# Patient Record
Sex: Male | Born: 1980 | Race: Black or African American | Hispanic: No | Marital: Single | Smoking: Never smoker
Health system: Southern US, Community
[De-identification: ages and names within clinical notes are randomized; demographics above are authoritative.]

## PROBLEM LIST (undated history)

## (undated) DIAGNOSIS — Z789 Other specified health status: Secondary | ICD-10-CM

## (undated) HISTORY — DX: Other specified health status: Z78.9

---

## 2007-08-24 HISTORY — PX: COLONOSCOPY: SHX174

## 2011-11-09 ENCOUNTER — Encounter: Payer: Self-pay | Admitting: Internal Medicine

## 2011-11-09 ENCOUNTER — Other Ambulatory Visit: Payer: Self-pay | Admitting: Internal Medicine

## 2011-11-09 ENCOUNTER — Ambulatory Visit (INDEPENDENT_AMBULATORY_CARE_PROVIDER_SITE_OTHER): Payer: BC Managed Care – PPO | Admitting: Internal Medicine

## 2011-11-09 VITALS — BP 114/80 | HR 96 | Temp 98.7°F | Resp 18 | Ht 68.25 in | Wt 212.0 lb

## 2011-11-09 DIAGNOSIS — R200 Anesthesia of skin: Secondary | ICD-10-CM

## 2011-11-09 DIAGNOSIS — R209 Unspecified disturbances of skin sensation: Secondary | ICD-10-CM

## 2011-11-09 DIAGNOSIS — Z0000 Encounter for general adult medical examination without abnormal findings: Secondary | ICD-10-CM | POA: Insufficient documentation

## 2011-11-09 NOTE — Progress Notes (Signed)
  Subjective:    Patient ID: William Hurst, male    DOB: Sep 29, 1980, 30 y.o.   MRN: [id number]  HPI Pt presents to clinic for complete physical. Notes 4-5 month h/o left >right lower leg intermittent numbness involving calf and sometimes foot. Noted sx's after suffered a bad sprain of left ankle (eversion). Had no knee or fibula injury. Ultimately wondered about other possible causes after developed similar sx's in right leg.  Denies back pain, leg weakness, or radicular leg pain. Sometimes is positional after sittting in car. Taking no medication for the problem. No other alleviating or exacerbating factors. Notes past h/o elevated bp without need of medications.   No past medical history on file. Past Surgical History  Procedure Date  . Colonoscopy 2009    normal per patient report    reports that he has never smoked. He has never used smokeless tobacco. He reports that he does not use illicit drugs. His alcohol history not on file. family history includes Diabetes in his father and Hypertension in his father and paternal grandmother.  There is no history of Breast cancer, and Colon cancer, and Prostate cancer, and Heart disease, . No Known Allergies     Review of Systems  Constitutional: Negative for fever, chills, diaphoresis and fatigue.  Respiratory: Negative for cough and shortness of breath.   Gastrointestinal: Negative for abdominal pain and blood in stool.  Musculoskeletal: Negative for back pain.  Neurological: Positive for numbness. Negative for weakness.  All other systems reviewed and are negative.       Objective:   Physical Exam  Physical Exam  Nursing note and vitals reviewed. Constitutional: He appears well-developed and well-nourished. No distress.  HENT:  Head: Normocephalic and atraumatic.  Right Ear: Tympanic membrane and external ear normal.  Left Ear: Tympanic membrane and external ear normal.  Nose: Nose normal.  Mouth/Throat: Uvula is midline,  oropharynx is clear and moist and mucous membranes are normal. No oropharyngeal exudate.  Eyes: Conjunctivae and EOM are normal. Pupils are equal, round, and reactive to light. Right eye exhibits no discharge. Left eye exhibits no discharge. No scleral icterus.  Neck: Neck supple. Carotid bruit is not present. No thyromegaly present.  Cardiovascular: Normal rate, regular rhythm and normal heart sounds.  Exam reveals no gallop and no friction rub.   No murmur heard. Pulmonary/Chest: Effort normal and breath sounds normal. No respiratory distress. He has no wheezes. He has no rales.  Abdominal: Soft. He exhibits no distension and no mass. There is no hepatosplenomegaly. There is no tenderness. There is no rebound. Hernia confirmed negative in the right inguinal area and confirmed negative in the left inguinal area.  Lymphadenopathy:    He has no cervical adenopathy.      Neurological: He is alert.  Skin: Skin is warm and dry. He is not diaphoretic.  Psychiatric: He has a normal mood and affect.   Neuro: bilateral le strenght 5/5. Gait nl. Monofilament testing nl bilateral lower legs and feet     Assessment & Plan:

## 2011-11-09 NOTE — Patient Instructions (Signed)
Please schedule fasting labs for tomorrow morning Cbc, chem7, a1c, lipid, lft, tsh, b12, urinalysis with reflex -v70.0 Also please schedule same labs for next year's physical

## 2011-11-09 NOTE — Assessment & Plan Note (Signed)
Nl exam. Obtain cpe labs. 

## 2011-11-09 NOTE — Assessment & Plan Note (Signed)
Obtain labs including glu, thyroid function and vitamin b12. If lab evaluation unrevealing then consider neurology consult. No clinical support for peroneal nerve injury

## 2011-11-11 LAB — URINALYSIS, ROUTINE W REFLEX MICROSCOPIC
Hgb urine dipstick: NEGATIVE
Ketones, ur: NEGATIVE mg/dL
Nitrite: NEGATIVE
Protein, ur: NEGATIVE mg/dL
Urobilinogen, UA: 0.2 mg/dL (ref 0.0–1.0)

## 2011-11-11 LAB — BASIC METABOLIC PANEL
BUN: 12 mg/dL (ref 6–23)
CO2: 26 mEq/L (ref 19–32)
Chloride: 101 mEq/L (ref 96–112)
Creat: 0.95 mg/dL (ref 0.50–1.35)

## 2011-11-11 LAB — TSH: TSH: 0.941 u[IU]/mL (ref 0.350–4.500)

## 2011-11-11 LAB — CBC
HCT: 48.2 % (ref 39.0–52.0)
MCV: 89.3 fL (ref 78.0–100.0)
RDW: 12.5 % (ref 11.5–15.5)
WBC: 4.6 10*3/uL (ref 4.0–10.5)

## 2011-11-11 LAB — HEMOGLOBIN A1C: Mean Plasma Glucose: 114 mg/dL (ref <117)

## 2011-11-11 LAB — VITAMIN B12: Vitamin B-12: 443 pg/mL (ref 211–911)

## 2011-11-11 LAB — LIPID PANEL
Total CHOL/HDL Ratio: 6 Ratio
VLDL: 35 mg/dL (ref 0–40)

## 2011-11-18 ENCOUNTER — Other Ambulatory Visit: Payer: Self-pay | Admitting: Internal Medicine

## 2011-11-18 DIAGNOSIS — E785 Hyperlipidemia, unspecified: Secondary | ICD-10-CM

## 2011-11-22 ENCOUNTER — Other Ambulatory Visit: Payer: Self-pay | Admitting: Internal Medicine

## 2011-11-22 DIAGNOSIS — R200 Anesthesia of skin: Secondary | ICD-10-CM

## 2012-05-25 ENCOUNTER — Encounter: Payer: Self-pay | Admitting: Internal Medicine

## 2012-05-25 ENCOUNTER — Ambulatory Visit (INDEPENDENT_AMBULATORY_CARE_PROVIDER_SITE_OTHER): Payer: BC Managed Care – PPO | Admitting: Internal Medicine

## 2012-05-25 VITALS — BP 124/90 | HR 77 | Temp 98.6°F | Resp 16 | Wt 214.8 lb

## 2012-05-25 DIAGNOSIS — R350 Frequency of micturition: Secondary | ICD-10-CM | POA: Insufficient documentation

## 2012-05-25 DIAGNOSIS — R3589 Other polyuria: Secondary | ICD-10-CM

## 2012-05-25 LAB — POCT URINALYSIS DIPSTICK
Blood, UA: NEGATIVE
Spec Grav, UA: 1.025
Urobilinogen, UA: 0.2

## 2012-05-25 MED ORDER — CIPROFLOXACIN HCL 500 MG PO TABS: 500.0000 mg | ORAL_TABLET | Freq: Two times a day (BID) | ORAL | Status: DC

## 2012-05-25 NOTE — Progress Notes (Signed)
  Subjective:    Patient ID: William Hurst, male    DOB: 25-Jul-1981, 31 y.o.   MRN: [id number]  HPI patient presents to clinic for evaluation of urinary frequency. Notes two-week history of increased urination without polyuria, dysuria, hematuria hesitancy or dribbling. Does note nocturia one to two times a night. Denies urethral discharge and is monogamous. No known history of hyperglycemia. No other alleviating or exacerbating factors. No other complaints. Declines influenza vaccine  No past medical history on file. Past Surgical History  Procedure Date  . Colonoscopy 2009    normal per patient report    reports that he has never smoked. He has never used smokeless tobacco. He reports that he does not use illicit drugs. His alcohol history not on file. family history includes Diabetes in his father and Hypertension in his father and paternal grandmother.  There is no history of Breast cancer, and Colon cancer, and Prostate cancer, and Heart disease, . No Known Allergies   Review of Systems see history of present illness     Objective:   Physical Exam  Nursing note and vitals reviewed. Constitutional: He appears well-developed and well-nourished. No distress.  HENT:  Head: Normocephalic and atraumatic.  Neurological: He is alert.  Skin: He is not diaphoretic.  Psychiatric: He has a normal mood and affect.          Assessment & Plan:

## 2012-05-25 NOTE — Assessment & Plan Note (Signed)
UA obtained demonstrating increased specific gravity and trace protein. Will obtain urine culture and urine LCR for GC and chlamydia. Begin ten-day course of Cipro 500 mg twice a day. Fasting fingerstick blood sugar normal. Followup if no improvement or worsening.

## 2012-05-26 LAB — GC/CHLAMYDIA PROBE AMP, URINE
Chlamydia, Swab/Urine, PCR: NEGATIVE
GC Probe Amp, Urine: NEGATIVE

## 2012-05-27 LAB — URINE CULTURE: Organism ID, Bacteria: NO GROWTH

## 2012-08-11 ENCOUNTER — Ambulatory Visit (HOSPITAL_BASED_OUTPATIENT_CLINIC_OR_DEPARTMENT_OTHER)
Admission: RE | Admit: 2012-08-11 | Discharge: 2012-08-11 | Disposition: A | Discharge status: Home / Self Care | Payer: BC Managed Care – PPO | Source: Ambulatory Visit | Attending: Internal Medicine | Admitting: Internal Medicine

## 2012-08-11 ENCOUNTER — Encounter: Payer: Self-pay | Admitting: Internal Medicine

## 2012-08-11 ENCOUNTER — Ambulatory Visit (INDEPENDENT_AMBULATORY_CARE_PROVIDER_SITE_OTHER): Payer: BC Managed Care – PPO | Admitting: Internal Medicine

## 2012-08-11 VITALS — BP 130/88 | HR 69 | Temp 98.5°F | Resp 14 | Wt 216.5 lb

## 2012-08-11 DIAGNOSIS — N509 Disorder of male genital organs, unspecified: Secondary | ICD-10-CM

## 2012-08-11 DIAGNOSIS — N50812 Left testicular pain: Secondary | ICD-10-CM

## 2012-08-11 MED ORDER — LEVOFLOXACIN 500 MG PO TABS: 500.0000 mg | ORAL_TABLET | Freq: Every day | ORAL | Status: DC

## 2012-08-11 NOTE — Progress Notes (Signed)
  Subjective:    Patient ID: William Hurst, male    DOB: 1981-04-07, 31 y.o.   MRN: [id number]  HPI Pt presents to clinic for evaluation of testicular pain. Notes ~3wk h/o left testicular discomfort described as mild. May have occurred after prolonged time without stopping to urinate. Past urinary frequency seems to have improved after course of abx but c/o persistent post void dribbling. Denies urethral discharge. No alleviating or exacerbating factors. Declines flu vaccine.   No past medical history on file. Past Surgical History  Procedure Date  . Colonoscopy 2009    normal per patient report    reports that he has never smoked. He has never used smokeless tobacco. He reports that he does not use illicit drugs. His alcohol history not on file. family history includes Diabetes in his father and Hypertension in his father and paternal grandmother.  There is no history of Breast cancer, and Colon cancer, and Prostate cancer, and Heart disease, . No Known Allergies   Review of Systems see hpi     Objective:   Physical Exam  Nursing note and vitals reviewed. Constitutional: He appears well-developed and well-nourished. No distress.  HENT:  Head: Normocephalic and atraumatic.  Eyes: Conjunctivae normal are normal.  Genitourinary: Testes normal and penis normal. Right testis shows no mass, no swelling and no tenderness. Right testis is descended. Left testis shows no mass, no swelling and no tenderness. Left testis is descended.  Neurological: He is alert.  Skin: Skin is warm and dry. He is not diaphoretic.  Psychiatric: He has a normal mood and affect.          Assessment & Plan:

## 2012-08-11 NOTE — Assessment & Plan Note (Signed)
Nl exam. Obtain ua, urine cx, urine lcr for gc/chlamydia. Schedule testicular US. Attempt course of levaquin. With chronic intermittent urinary sx's as well consider urology consult if no resolution.

## 2012-08-12 LAB — URINALYSIS, ROUTINE W REFLEX MICROSCOPIC
Bilirubin Urine: NEGATIVE
Glucose, UA: NEGATIVE mg/dL
Leukocytes, UA: NEGATIVE
Protein, ur: NEGATIVE mg/dL
Specific Gravity, Urine: 1.025 (ref 1.005–1.030)
Urobilinogen, UA: 0.2 mg/dL (ref 0.0–1.0)

## 2012-08-12 LAB — GC/CHLAMYDIA PROBE AMP, URINE: GC Probe Amp, Urine: NEGATIVE

## 2012-08-13 LAB — URINE CULTURE: Colony Count: 1000

## 2012-11-09 ENCOUNTER — Encounter: Payer: BC Managed Care – PPO | Admitting: Family Medicine

## 2012-11-09 DIAGNOSIS — Z0289 Encounter for other administrative examinations: Secondary | ICD-10-CM

## 2013-04-27 ENCOUNTER — Ambulatory Visit (INDEPENDENT_AMBULATORY_CARE_PROVIDER_SITE_OTHER): Payer: BC Managed Care – PPO | Admitting: Physician Assistant

## 2013-04-27 ENCOUNTER — Encounter: Payer: Self-pay | Admitting: Physician Assistant

## 2013-04-27 VITALS — BP 124/90 | HR 77 | Temp 98.6°F | Resp 16 | Wt 212.5 lb

## 2013-04-27 DIAGNOSIS — R252 Cramp and spasm: Secondary | ICD-10-CM

## 2013-04-27 DIAGNOSIS — R350 Frequency of micturition: Secondary | ICD-10-CM

## 2013-04-27 LAB — POCT URINALYSIS DIPSTICK
Bilirubin, UA: NEGATIVE
Glucose, UA: NEGATIVE
Ketones, UA: NEGATIVE
Spec Grav, UA: 1.01
Urobilinogen, UA: 0.2

## 2013-04-27 LAB — BASIC METABOLIC PANEL
BUN: 9 mg/dL (ref 6–23)
CO2: 26 mEq/L (ref 19–32)
Chloride: 102 mEq/L (ref 96–112)
Glucose, Bld: 84 mg/dL (ref 70–99)
Potassium: 4.5 mEq/L (ref 3.5–5.3)

## 2013-04-27 NOTE — Patient Instructions (Signed)
Please increase water intake.  Avoid water intake just before bed.  Avoid caffeine as it irritates the bladder and can cause the urge to urinate.  Please limit alcohol consumption.  Try a daily cranberry supplement (cranberry AZO).  If your culture comes back positive, we will treat you with antibiotics.  Follow-up in 2 weeks.  I will call you with lab results.  Please increase your potassium intake to help with muscle cramps.    Foods Rich in Potassium Food / Potassium (mg)  Apricots, dried,  cup / 378 mg   Apricots, raw, 1 cup halves / 401 mg   Avocado,  / 487 mg   Banana, 1 large / 487 mg   Beef, lean, round, 3 oz / 202 mg   Cantaloupe, 1 cup cubes / 427 mg   Dates, medjool, 5 whole / 835 mg   Ham, cured, 3 oz / 212 mg   Lentils, dried,  cup / 458 mg   Lima beans, frozen,  cup / 258 mg   Orange, 1 large / 333 mg   Orange juice, 1 cup / 443 mg   Peaches, dried,  cup / 398 mg   Peas, split, cooked,  cup / 355 mg   Potato, boiled, 1 medium / 515 mg   Prunes, dried, uncooked,  cup / 318 mg   Raisins,  cup / 309 mg   Salmon, pink, raw, 3 oz / 275 mg   Sardines, canned , 3 oz / 338 mg   Tomato, raw, 1 medium / 292 mg   Tomato juice, 6 oz / 417 mg   [place], 3 oz / 349 mg  Document Released: 08/09/2005 Document Revised: 04/21/2011 Document Reviewed: 12/23/2008 [facility] Patient Information 2012 [place], [place].

## 2013-04-27 NOTE — Progress Notes (Signed)
Patient ID: William Hurst, male   DOB: 11-22-1980, 32 y.o.   MRN: [id number]  Patient presents to clinic today c/o increased urinary frequency for the past several weeks.  Denies dysuria, urinary urgency, urinary hesitancy, post-void dribbling, fever, chills, N/V, suprapubic pain, flank pain, hematuria.  Patient is sexually active with his wife.  Endorses monogamy but no use of portection.  Denies penile/scrotal/testicular pain or tenderness.  Denies discharge.  Denies history of diabetes.  Endorses moderate caffeine intake and alcohol consumption, mostly in the evenings.  Patient also complains of muscle crams of LLE that have been present for the past few weeks.  Denies muscle cramping elsewhere.  Endorses some muscle weakness.  Denies numbness, tingling, burning of extremities.  Denies trauma to LLE.  Denies history of hypokalemia or other electrolyte abnormality.  Does not interfere with daily life.  Patient remains very active.  Does not always eat a well-balanced diet.   No past medical history on file.  No current outpatient prescriptions on file prior to visit.   No current facility-administered medications on file prior to visit.    No Known Allergies  Family History  Problem Relation Age of Onset  . Hypertension Father   . Hypertension Paternal Grandmother   . Breast cancer Neg Hx   . Colon cancer Neg Hx   . Prostate cancer Neg Hx   . Diabetes Father   . Heart disease Neg Hx     History   Social History  . Marital Status: Single    Spouse Name: N/A    Number of Children: N/A  . Years of Education: N/A   Social History Main Topics  . Smoking status: Never Smoker   . Smokeless tobacco: Never Used  . Alcohol Use: None  . Drug Use: No  . Sexual Activity: None   Other Topics Concern  . None   Social History Narrative  . None   Review of Systems  Constitutional: Negative for fever, chills, weight loss and malaise/fatigue.  Gastrointestinal: Negative for nausea,  vomiting, abdominal pain, diarrhea and constipation.  Genitourinary: Positive for frequency. Negative for dysuria, urgency, hematuria and flank pain.  Musculoskeletal: Positive for myalgias. Negative for back pain.  Neurological: Negative for tingling and sensory change.  Endo/Heme/Allergies: Negative for polydipsia.   Filed Vitals:   04/27/13 1327  BP: 124/90  Pulse: 77  Temp: 98.6 F (37 C)  Resp: 16    Physical Exam  Vitals reviewed. Constitutional: He is oriented to person, place, and time and well-developed, well-nourished, and in no distress.  HENT:  Head: Normocephalic and atraumatic.  Neck: Neck supple.  Cardiovascular: Normal rate, regular rhythm, normal heart sounds and intact distal pulses.   Pulmonary/Chest: Effort normal and breath sounds normal.  Musculoskeletal: He exhibits no edema and no tenderness.  Neurological: He is alert and oriented to person, place, and time. No cranial nerve deficit.  Skin: Skin is warm and dry. No rash noted.   Diagnostics: Urine dipstick -- no evidence of infection.   POC Glucose -- WNL  Assessment/Plan: Urinary frequency Urine dipstick WNL. Will send for micro and culture.  Urine G/C sent.  POC Glucose WNL.  Advised patient to eliminate caffeine from diet.  Encourage decrease in alcohol, especially before bedtime.  F/U in 2 weeks.  Muscle cramping Check BMP at today's visit.  Encourage balanced diet with foods high in potassium.  List given to patient.  Follow-up in 2 weeks.

## 2013-04-27 NOTE — Assessment & Plan Note (Signed)
Urine dipstick WNL. Will send for micro and culture.  Urine G/C sent.  POC Glucose WNL.  Advised patient to eliminate caffeine from diet.  Encourage decrease in alcohol, especially before bedtime.  F/U in 2 weeks.

## 2013-04-27 NOTE — Assessment & Plan Note (Signed)
Check BMP at today's visit.  Encourage balanced diet with foods high in potassium.  List given to patient.  Follow-up in 2 weeks.

## 2013-04-28 LAB — URINALYSIS, ROUTINE W REFLEX MICROSCOPIC
Bilirubin Urine: NEGATIVE
Glucose, UA: NEGATIVE mg/dL
Ketones, ur: NEGATIVE mg/dL
Specific Gravity, Urine: 1.008 (ref 1.005–1.030)

## 2013-05-11 ENCOUNTER — Encounter: Payer: Self-pay | Admitting: Physician Assistant

## 2013-05-11 ENCOUNTER — Ambulatory Visit (INDEPENDENT_AMBULATORY_CARE_PROVIDER_SITE_OTHER): Payer: BC Managed Care – PPO | Admitting: Physician Assistant

## 2013-05-11 VITALS — BP 118/92 | HR 60 | Temp 98.7°F | Resp 16 | Ht 68.5 in | Wt 211.2 lb

## 2013-05-11 DIAGNOSIS — R3911 Hesitancy of micturition: Secondary | ICD-10-CM

## 2013-05-11 DIAGNOSIS — R350 Frequency of micturition: Secondary | ICD-10-CM

## 2013-05-11 DIAGNOSIS — Z0000 Encounter for general adult medical examination without abnormal findings: Secondary | ICD-10-CM

## 2013-05-11 LAB — HEPATIC FUNCTION PANEL
AST: 16 U/L (ref 0–37)
Albumin: 4.5 g/dL (ref 3.5–5.2)
Alkaline Phosphatase: 48 U/L (ref 39–117)
Indirect Bilirubin: 0.4 mg/dL (ref 0.0–0.9)
Total Bilirubin: 0.5 mg/dL (ref 0.3–1.2)
Total Protein: 7.4 g/dL (ref 6.0–8.3)

## 2013-05-11 LAB — CBC WITH DIFFERENTIAL/PLATELET
Eosinophils Absolute: 0.1 10*3/uL (ref 0.0–0.7)
Eosinophils Relative: 2 % (ref 0–5)
HCT: 46.2 % (ref 39.0–52.0)
Hemoglobin: 15.7 g/dL (ref 13.0–17.0)
Lymphocytes Relative: 64 % — ABNORMAL HIGH (ref 12–46)
Lymphs Abs: 2.9 10*3/uL (ref 0.7–4.0)
MCH: 30.1 pg (ref 26.0–34.0)
MCV: 88.5 fL (ref 78.0–100.0)
Monocytes Relative: 6 % (ref 3–12)
RBC: 5.22 MIL/uL (ref 4.22–5.81)

## 2013-05-11 LAB — LIPID PANEL
LDL Cholesterol: 192 mg/dL — ABNORMAL HIGH (ref 0–99)
Total CHOL/HDL Ratio: 6.1 Ratio

## 2013-05-11 LAB — HEMOGLOBIN A1C: Hgb A1c MFr Bld: 5.8 % — ABNORMAL HIGH (ref <5.7)

## 2013-05-11 NOTE — Progress Notes (Signed)
Patient ID: William Hurst, male   DOB: 02-Nov-1980, 32 y.o.   MRN: [id number]  Patient presents to clinic today for annual exam.  Patient with no significant PMH.   Urinary frequency improved with lifestyle measures discussed at last visit.  Labs unremarkable at that time.  Patient requests PSA due to family history and father's prostate "issues".  Denies prostate cancer in family.  Health Maintenance: Vision -- Overdue.  Discussed importance of annual eye exam Dental -- last visit 2 days ago.  Is scheduled to have wisdom teeth removal Immunizations -- patient endorses being up to date.  Patient declines flu shot.   History reviewed. No pertinent past medical history.  Current Outpatient Prescriptions on File Prior to Visit  Medication Sig Dispense Refill  . Multiple Vitamins-Minerals (MENS MULTIVITAMIN PLUS) TABS Take 1 tablet by mouth daily.       No current facility-administered medications on file prior to visit.    No Known Allergies  Family History  Problem Relation Age of Onset  . Hypertension Father   . Hypertension Paternal Grandmother   . Breast cancer Neg Hx   . Colon cancer Neg Hx   . Prostate cancer Neg Hx   . Diabetes Father   . Heart disease Neg Hx     History   Social History  . Marital Status: Single    Spouse Name: N/A    Number of Children: N/A  . Years of Education: N/A   Social History Main Topics  . Smoking status: Never Smoker   . Smokeless tobacco: Never Used  . Alcohol Use: Yes     Comment: rare  . Drug Use: No  . Sexual Activity: Yes    Birth Control/ Protection: None     Comment: married   Other Topics Concern  . None   Social History Narrative  . None   Review of Systems  Constitutional: Negative for fever, chills, weight loss and malaise/fatigue.  HENT: Negative for hearing loss, ear pain, tinnitus and ear discharge.   Eyes: Negative for blurred vision, double vision, photophobia and pain.  Respiratory: Negative for cough,  shortness of breath and wheezing.   Cardiovascular: Negative for chest pain and palpitations.  Gastrointestinal: Negative for heartburn, nausea, vomiting, abdominal pain, diarrhea, constipation, blood in stool and melena.  Genitourinary: Negative for dysuria, urgency, frequency, hematuria and flank pain.  Musculoskeletal: Negative for myalgias and back pain.  Skin: Negative for rash.  Neurological: Negative for seizures, loss of consciousness and headaches.  Endo/Heme/Allergies: Negative for environmental allergies. Does not bruise/bleed easily.  Psychiatric/Behavioral: Negative for depression, suicidal ideas, hallucinations and substance abuse. The patient is not nervous/anxious and does not have insomnia.    Filed Vitals:   05/11/13 0948  BP: 118/92  Pulse:   Temp:   Resp:    Physical Exam  Vitals reviewed. Constitutional: He is oriented to person, place, and time and well-developed, well-nourished, and in no distress.  HENT:  Head: Normocephalic and atraumatic.  Right Ear: External ear normal.  Left Ear: External ear normal.  Nose: Nose normal.  Mouth/Throat: Oropharynx is clear and moist. No oropharyngeal exudate.  Eyes: Conjunctivae and EOM are normal. Pupils are equal, round, and reactive to light.  Neck: Normal range of motion. Neck supple. No thyromegaly present.  Cardiovascular: Normal rate, regular rhythm, normal heart sounds and intact distal pulses.   Pulmonary/Chest: Effort normal and breath sounds normal.  Abdominal: Soft. Bowel sounds are normal. He exhibits no distension and no mass. There  is no tenderness. There is no rebound and no guarding.  Genitourinary: Testes/scrotum normal and penis normal. No discharge found.  Musculoskeletal: Normal range of motion.  Lymphadenopathy:    He has no cervical adenopathy.  Neurological: He is alert and oriented to person, place, and time. He has normal reflexes. No cranial nerve deficit.  Skin: Skin is warm and dry.      Assessment/Plan: Urinary frequency Patient reports symptoms are improved since dietary/lifestyle change.  Labs at last visit were unremarkable  Encounter for preventive health examination Will obtain fasting labs.  Will check PSA given family history.  Health Maintenance discussed with patient.

## 2013-05-11 NOTE — Assessment & Plan Note (Signed)
Patient reports symptoms are improved since dietary/lifestyle change.  Labs at last visit were unremarkable

## 2013-05-11 NOTE — Assessment & Plan Note (Signed)
Will obtain fasting labs.  Will check PSA given family history.  Health Maintenance discussed with patient.

## 2013-05-11 NOTE — Patient Instructions (Signed)
Please obtain labs.  I will call you with the results.  Please read information about Saw Palmetto.  You can start taking a supplement to help with urination. Please make follow-up appointment for skin tag removal.  Saw Palmetto, Serenoa repens tablets and capsules What is this medicine? SAW PALMETTO (saw pal MET oh) is an herbal or dietary supplement. It is promoted to help support prostate health. The FDA has not approved this supplement for any medical use. This supplement may be used for other purposes; ask your health care provider or pharmacist if you have questions. This medicine may be used for other purposes; ask your health care provider or pharmacist if you have questions. What should I tell my health care provider before I take this medicine? They need to know if you have any of these conditions: -liver disease -prostate cancer -an unusual or allergic reaction to saw palmetto, other herbs, plants, medicines, foods, dyes, or preservatives -pregnant or trying to get pregnant -breast-feeding How should I use this medicine? Take this supplement by mouth with a glass of water. Follow the directions on the package labeling, or take as directed by your health care professional. If this supplement upsets your stomach, take it with food. Do not take this supplement more often than directed. Contact your pediatrician regarding the use of this supplement in children. Special care may be needed. Overdosage: If you think you have taken too much of this medicine contact a poison control center or emergency room at once. NOTE: This medicine is only for you. Do not share this medicine with others. What if I miss a dose? If you miss a dose, take it as soon as you can. If it is almost time for your next dose, take only that dose. Do not take double or extra doses. What may interact with this medicine? -other medicines for the prostate like finasteride, tamsulosin -hormones -warfarin This list may  not describe all possible interactions. Give your health care provider a list of all the medicines, herbs, non-prescription drugs, or dietary supplements you use. Also tell them if you smoke, drink alcohol, or use illegal drugs. Some items may interact with your medicine. What should I watch for while using this medicine? Tell your doctor or healthcare professional if your symptoms do not start to get better or if they get worse. If you are scheduled for any medical or dental procedure, tell your healthcare provider that you are taking this supplement. You may need to stop taking this supplement before the procedure. Herbal or dietary supplements are not regulated like medicines. Rigid quality control standards are not required for dietary supplements. The purity and strength of these products can vary. The safety and effect of this dietary supplement for a certain disease or illness is not well known. This product is not intended to diagnose, treat, cure or prevent any disease. The Food and Drug Administration suggests the following to help consumers protect themselves: -Always read product labels and follow directions. -Natural does not mean a product is safe for humans to take. -Look for products that include USP after the ingredient name. This means that the manufacturer followed the standards of the [place] Pharmacopoeia. -Supplements made or sold by a nationally known food or drug company are more likely to be made under tight controls. You can write to the company for more information about how the product was made. What side effects may I notice from receiving this medicine? Side effects that you should report to your  doctor or health care professional as soon as possible: -allergic reactions like skin rash, itching or hives, swelling of the face, lips, or tongue -breathing problems -dark urine -fever -general ill feeling or flu-like symptoms -light-colored stools -loss of appetite,  nausea -right upper belly pain -trouble passing urine or change in the amount of urine -unusually weak or tired -yellowing of the eyes or skin Side effects that usually do not require medical attention (report to your doctor or health care professional if they continue or are bothersome): -breast tenderness or enlargement -diarrhea -headache -stomach upset This list may not describe all possible side effects. Call your doctor for medical advice about side effects. You may report side effects to FDA at 1-800-FDA-1088. Where should I keep my medicine? Keep out of the reach of children. Store at room temperature between 15 and 30 degrees C (59 and 86 degrees F) or as directed on the package label. Protect from moisture. Throw away any unused supplement after the expiration date. NOTE: This sheet is a summary. It may not cover all possible information. If you have questions about this medicine, talk to your doctor, pharmacist, or health care provider.  2012, [employer] Standard. (04/11/2008 3:15:00 PM)

## 2013-05-12 LAB — TSH: TSH: 0.847 u[IU]/mL (ref 0.350–4.500)

## 2013-05-13 ENCOUNTER — Telehealth: Payer: Self-pay | Admitting: Physician Assistant

## 2013-05-13 DIAGNOSIS — E785 Hyperlipidemia, unspecified: Secondary | ICD-10-CM

## 2013-05-13 DIAGNOSIS — R739 Hyperglycemia, unspecified: Secondary | ICD-10-CM

## 2013-05-13 NOTE — Telephone Encounter (Signed)
Please inform patient that labs look good overall.  He has high cholesterol with LDL (lousy) cholesterol being 192.  He should start monitoring cholesterol and saturated fat intake.  He should also start a fish oil or krill oil supplement.  I am sending in an rx for atorvastatin 10 mg daily.  Patient is to be advised that most common side effects are muscle aches, joint aches, headaches.  His A1C level also puts him at risk for developing diabetes.  Again diet and exercise should help with this.  Will want to recheck his cholesterol in 3 months.

## 2013-05-15 MED ORDER — ATORVASTATIN CALCIUM 10 MG PO TABS: 10.0000 mg | ORAL_TABLET | Freq: Every day | ORAL | Status: DC

## 2013-05-15 NOTE — Telephone Encounter (Signed)
Patient informed, understood & agreed to diet, exercise & fish or krill oil supplement, unsure as to if he will take the Atorvastatin [sent to pharmacy]; informed pt of increased risk of elevated cholesterol and A1C, scheduled patient for 3-mth F/U 12.17.14 @ 9:00a with Fasting labs to be done prior, lab orders to [facility]; provider informed of pt response to medication/SLS

## 2013-05-16 ENCOUNTER — Ambulatory Visit (INDEPENDENT_AMBULATORY_CARE_PROVIDER_SITE_OTHER): Payer: BC Managed Care – PPO | Admitting: Physician Assistant

## 2013-05-16 ENCOUNTER — Encounter: Payer: Self-pay | Admitting: Physician Assistant

## 2013-05-16 VITALS — BP 120/88 | HR 72 | Temp 97.9°F | Resp 16 | Ht 68.5 in | Wt 214.5 lb

## 2013-05-16 DIAGNOSIS — B353 Tinea pedis: Secondary | ICD-10-CM

## 2013-05-16 DIAGNOSIS — E785 Hyperlipidemia, unspecified: Secondary | ICD-10-CM

## 2013-05-16 DIAGNOSIS — R899 Unspecified abnormal finding in specimens from other organs, systems and tissues: Secondary | ICD-10-CM | POA: Insufficient documentation

## 2013-05-16 DIAGNOSIS — R6889 Other general symptoms and signs: Secondary | ICD-10-CM

## 2013-05-16 MED ORDER — TERBINAFINE HCL 1 % EX CREA: TOPICAL_CREAM | Freq: Two times a day (BID) | CUTANEOUS | Status: DC

## 2013-05-16 NOTE — Patient Instructions (Signed)
Please Read Information Below.  I want to see you in 3  Months.   Fish Oil, Omega-3 Fatty Acids capsules (OTC) What is this medicine? FISH OIL, OMEGA-3 FATTY ACIDS (Fish Oil, oh MAY ga - 3 fatty AS ids) are essential fats. It is promoted to help support a healthy heart. This dietary supplement is used to add to a healthy diet. The FDA has not approved this supplement for any medical use. This supplement may be used for other purposes; ask your health care provider or pharmacist if you have questions. This medicine may be used for other purposes; ask your health care provider or pharmacist if you have questions. What should I tell my health care provider before I take this medicine? They need to know if you have any of these conditions -bleeding problems -lung or breathing disease, like asthma -an unusual or allergic reaction to fish oil, omega-3 fatty acids, fish, other medicines, foods, dyes, or preservatives -pregnant or trying to get pregnant -breast-feeding How should I use this medicine? Take this medicine by mouth with a glass of water. Follow the directions on the package or prescription label. Take with food. Take your medicine at regular intervals. Do not take your medicine more often than directed. Talk to your pediatrician regarding the use of this medicine in children. Special care may be needed. This medicine should not be used in children without a doctor's advice. Overdosage: If you think you have taken too much of this medicine contact a poison control center or emergency room at once. NOTE: This medicine is only for you. Do not share this medicine with others. What if I miss a dose? If you miss a dose, take it as soon as you can. If it is almost time for your next dose, take only that dose. Do not take double or extra doses. What may interact with this medicine? -aspirin and aspirin-like medicines -herbal products like danshen, dong quai, garlic pills, ginger, ginkgo biloba,  horse chestnut, willow bark, and others -medicines that treat or prevent blood clots like enoxaparin, heparin, warfarin This list may not describe all possible interactions. Give your health care provider a list of all the medicines, herbs, non-prescription drugs, or dietary supplements you use. Also tell them if you smoke, drink alcohol, or use illegal drugs. Some items may interact with your medicine. What should I watch for while using this medicine? Follow a good diet and exercise plan. Taking a dietary supplement does not replace a healthy lifestyle. Some foods that have omega-3 fatty acids naturally are fatty fish like albacore tuna, halibut, herring, mackerel, lake trout, salmon, and sardines. Too much of this supplement can be unsafe. Talk to your doctor or health care provider about how much of this supplement is right for you. If you are scheduled for any medical or dental procedure, tell your healthcare provider that you are taking this medicine. You may need to stop taking this medicine before the procedure. Herbal or dietary supplements are not regulated like medicines. Rigid quality control standards are not required for dietary supplements. The purity and strength of these products can vary. The safety and effect of this dietary supplement for a certain disease or illness is not well known. This product is not intended to diagnose, treat, cure or prevent any disease. The Food and Drug Administration suggests the following to help consumers protect themselves: -Always read product labels and follow directions. -Natural does not mean a product is safe for humans to take. -Look for  products that include USP after the ingredient name. This means that the manufacturer followed the standards of the [place] Pharmacopoeia. -Supplements made or sold by a nationally known food or drug company are more likely to be made under tight controls. You can write to the company for more information about how the  product was made. What side effects may I notice from receiving this medicine? Side effects that you should report to your doctor or health care professional as soon as possible: -allergic reactions like skin rash, itching or hives, swelling of the face, lips, or tongue -breathing problems -changes in your moods or emotions -unusual bleeding or bruising Side effects that usually do not require medical attention (report to your doctor or health care professional if they continue or are bothersome): -bad or fishy breath -belching -diarrhea -nausea -stomach gas, upset -weight gain This list may not describe all possible side effects. Call your doctor for medical advice about side effects. You may report side effects to FDA at 1-800-FDA-1088. Where should I keep my medicine? Keep out of the reach of children. Store at room temperature or as directed on the package label. Protect from moisture. Do not freeze. Throw away any unused medicine after the expiration date. NOTE: This sheet is a summary. It may not cover all possible information. If you have questions about this medicine, talk to your doctor, pharmacist, or health care provider.  2012, Elsevier/Gold Standard. (10/26/2007 1:05:24 PM)  Fat and Cholesterol Control Diet Cholesterol is a wax-like substance. It comes from your liver and is found in certain foods. There is good (HDL) and bad (LDL) cholesterol. Too much cholesterol in your blood can affect your heart. Certain foods can lower or raise your cholesterol. Eat foods that are low in cholesterol. Saturated and trans fats are bad fats found in foods that will raise your cholesterol. Do not eat foods that are high in saturated and trans fats. FOODS HIGHER IN SATURATED AND TRANS FATS  Dairy products, such as whole milk, eggs, cheese, cream, and butter.  Fatty meats, such as hot dogs, sausage, and salami.  Fried foods.  Trans fats which are found in margarine and pre-made cookies,  crackers, and baked goods.  Tropical oils, such as coconut and palm oils. Read package labels at the store. Do not buy products that use saturated or trans fats or hydrogenated oils. Find foods labeled:  Low-fat.  Low-saturated fat.  Trans-fat-free.  Low-cholesterol. FOODS LOWER IN CHOLESTEROL   Fruit.  Vegetables.  Beans, peas, and lentils.  Fish.  Lean meat, such as chicken (without skin) or ground [place].  Grains, such as barley, rice, couscous, bulgur wheat, and pasta.  Heart-healthy tub margarine. PREPARING YOUR FOOD  Broil, bake, steam, or roast foods. Do not fry food.  Use non-stick cooking sprays.  Use lemon or herbs to flavor food instead of using butter or stick margarine.  Use nonfat yogurt, salsa, or low-fat dressings for salads. Document Released: 02/08/2012 Document Reviewed: 02/08/2012 [facility] Patient Information 2014 [place], [place].

## 2013-05-16 NOTE — Assessment & Plan Note (Signed)
Instructions given for diet to help with increasing metabolism and stabilizing blood sugars.  Patient to follow-up in 3 months.

## 2013-05-16 NOTE — Assessment & Plan Note (Signed)
Trial of lifestyle interventions including diet and exercise.  Information given to patient.  Krill oil/Fish oil supplement.  Follow-up in 3 months with repeat labs.

## 2013-05-16 NOTE — Progress Notes (Signed)
Patient ID: William Hurst, male   DOB: 15-Jul-1981, 32 y.o.   MRN: [id number]  Patient presents to clinic today to discuss lab results.  Patient was called by nursing staff with his results and instructions, but states he wasn't able to understand them.  Wants to get a better understanding of results so that he can stay healthy.  No acute concerns.  No past medical history on file.  Current Outpatient Prescriptions on File Prior to Visit  Medication Sig Dispense Refill  . Multiple Vitamins-Minerals (MENS MULTIVITAMIN PLUS) TABS Take 1 tablet by mouth daily.      Marland Kitchen atorvastatin (LIPITOR) 10 MG tablet Take 1 tablet (10 mg total) by mouth daily.  30 tablet  2   No current facility-administered medications on file prior to visit.    No Known Allergies  Family History  Problem Relation Age of Onset  . Hypertension Father   . Hypertension Paternal Grandmother   . Breast cancer Neg Hx   . Colon cancer Neg Hx   . Prostate cancer Neg Hx   . Diabetes Father   . Heart disease Neg Hx     History   Social History  . Marital Status: Single    Spouse Name: N/A    Number of Children: N/A  . Years of Education: N/A   Social History Main Topics  . Smoking status: Never Smoker   . Smokeless tobacco: Never Used  . Alcohol Use: Yes     Comment: rare  . Drug Use: No  . Sexual Activity: Yes    Birth Control/ Protection: None     Comment: married   Other Topics Concern  . None   Social History Narrative  . None   Review of Systems  Eyes: Negative for blurred vision and double vision.  Cardiovascular: Negative for chest pain and palpitations.  Genitourinary: Positive for frequency. Negative for dysuria, urgency, hematuria and flank pain.  Neurological: Negative for dizziness, tingling and headaches.  Endo/Heme/Allergies: Negative for polydipsia.   Filed Vitals:   05/16/13 0923  BP: 120/88  Pulse: 72  Temp: 97.9 F (36.6 C)  Resp: 16   Physical Exam  Vitals  reviewed. Constitutional: He is oriented to person, place, and time and well-developed, well-nourished, and in no distress.  HENT:  Head: Normocephalic and atraumatic.  Eyes: Conjunctivae are normal.  Neurological: He is alert and oriented to person, place, and time.  Skin: Skin is warm and dry. No rash noted.   Recent Results (from the past 2160 hour(s))  URINE CULTURE     Status: None   Collection Time    04/27/13  1:43 PM      Result Value Range   Colony Count NO GROWTH     Organism ID, Bacteria NO GROWTH    URINALYSIS, ROUTINE W REFLEX MICROSCOPIC     Status: None   Collection Time    04/27/13  1:43 PM      Result Value Range   Color, Urine YELLOW  YELLOW   APPearance CLEAR  CLEAR   Specific Gravity, Urine 1.008  1.005 - 1.030   pH 6.0  5.0 - 8.0   Glucose, UA NEG  NEG mg/dL   Bilirubin Urine NEG  NEG   Ketones, ur NEG  NEG mg/dL   Hgb urine dipstick NEG  NEG   Protein, ur NEG  NEG mg/dL   Urobilinogen, UA 0.2  0.0 - 1.0 mg/dL   Nitrite NEG  NEG   Leukocytes, UA  NEG  NEG  POCT URINALYSIS DIPSTICK     Status: None   Collection Time    04/27/13  1:43 PM      Result Value Range   Color, UA golden     Clarity, UA bubbly     Glucose, UA neg     Bilirubin, UA neg     Ketones, UA neg     Spec Grav, UA 1.010     Blood, UA eg     pH, UA 6.0     Protein, UA neg     Urobilinogen, UA 0.2     Nitrite, UA neg     Leukocytes, UA Negative    BASIC METABOLIC PANEL     Status: None   Collection Time    04/27/13  1:59 PM      Result Value Range   Sodium 138  135 - 145 mEq/L   Potassium 4.5  3.5 - 5.3 mEq/L   Chloride 102  96 - 112 mEq/L   CO2 26  19 - 32 mEq/L   Glucose, Bld 84  70 - 99 mg/dL   BUN 9  6 - 23 mg/dL   Creat 4.54  0.98 - 1.19 mg/dL   Calcium 9.8  8.4 - 14.7 mg/dL  GLUCOSE, POCT (MANUAL RESULT ENTRY)     Status: None   Collection Time    04/27/13  2:03 PM      Result Value Range   POC Glucose 88  70 - 99 mg/dl   Comment: Non-fasting  GC/CHLAMYDIA PROBE  AMP     Status: None   Collection Time    04/27/13  2:11 PM      Result Value Range   CT Probe RNA NEGATIVE     GC Probe RNA NEGATIVE     Comment:                                                                                            Normal Reference Range: Negative                 Assay performed using the Gen-Probe APTIMA COMBO2 (R) Assay.           Acceptable specimen types for this assay include APTIMA Swabs (Unisex,     endocervical, urethral, or vaginal), first void urine, and ThinPrep     liquid based cytology samples.  CBC WITH DIFFERENTIAL     Status: Abnormal   Collection Time    05/11/13 10:43 AM      Result Value Range   WBC 4.5  4.0 - 10.5 K/uL   RBC 5.22  4.22 - 5.81 MIL/uL   Hemoglobin 15.7  13.0 - 17.0 g/dL   HCT 82.9  56.2 - 13.0 %   MCV 88.5  78.0 - 100.0 fL   MCH 30.1  26.0 - 34.0 pg   MCHC 34.0  30.0 - 36.0 g/dL   RDW 86.5  78.4 - 69.6 %   Platelets 226  150 - 400 K/uL   Neutrophils Relative % 28 (*) 43 - 77 %   Neutro Abs 1.3 (*)  1.7 - 7.7 K/uL   Lymphocytes Relative 64 (*) 12 - 46 %   Lymphs Abs 2.9  0.7 - 4.0 K/uL   Monocytes Relative 6  3 - 12 %   Monocytes Absolute 0.3  0.1 - 1.0 K/uL   Eosinophils Relative 2  0 - 5 %   Eosinophils Absolute 0.1  0.0 - 0.7 K/uL   Basophils Relative 0  0 - 1 %   Basophils Absolute 0.0  0.0 - 0.1 K/uL   Smear Review Criteria for review not met    HEPATIC FUNCTION PANEL     Status: None   Collection Time    05/11/13 10:43 AM      Result Value Range   Total Bilirubin 0.5  0.3 - 1.2 mg/dL   Bilirubin, Direct 0.1  0.0 - 0.3 mg/dL   Indirect Bilirubin 0.4  0.0 - 0.9 mg/dL   Alkaline Phosphatase 48  39 - 117 U/L   AST 16  0 - 37 U/L   ALT 19  0 - 53 U/L   Total Protein 7.4  6.0 - 8.3 g/dL   Albumin 4.5  3.5 - 5.2 g/dL  LIPID PANEL     Status: Abnormal   Collection Time    05/11/13 10:43 AM      Result Value Range   Cholesterol 268 (*) 0 - 200 mg/dL   Comment: ATP III Classification:           < 200         mg/dL        Desirable          200 - 239     mg/dL        Borderline High          >= 240        mg/dL        High         Triglycerides 160 (*) <150 mg/dL   HDL 44  >16 mg/dL   Total CHOL/HDL Ratio 6.1     VLDL 32  0 - 40 mg/dL   LDL Cholesterol 109 (*) 0 - 99 mg/dL   Comment:       Total Cholesterol/HDL Ratio:CHD Risk                            Coronary Heart Disease Risk Table                                            Men       Women              1/2 Average Risk              3.4        3.3                  Average Risk              5.0        4.4               2X Average Risk              9.6        7.1               3X  Average Risk             23.4       11.0     Use the calculated Patient Ratio above and the CHD Risk table      to determine the patient's CHD Risk.     ATP III Classification (LDL):           < 100        mg/dL         Optimal          100 - 129     mg/dL         Near or Above Optimal          130 - 159     mg/dL         Borderline High          160 - 189     mg/dL         High           > 190        mg/dL         Very High        TSH     Status: None   Collection Time    05/11/13 10:43 AM      Result Value Range   TSH 0.847  0.350 - 4.500 uIU/mL  HEMOGLOBIN A1C     Status: Abnormal   Collection Time    05/11/13 10:43 AM      Result Value Range   Hemoglobin A1C 5.8 (*) <5.7 %   Comment:                                                                            According to the ADA Clinical Practice Recommendations for 2011, when     HbA1c is used as a screening test:             >=6.5%   Diagnostic of Diabetes Mellitus                (if abnormal result is confirmed)           5.7-6.4%   Increased risk of developing Diabetes Mellitus           References:Diagnosis and Classification of Diabetes Mellitus,Diabetes     Care,2011,34(Suppl 1):S62-S69 and Standards of Medical Care in             Diabetes - 2011,Diabetes Care,2011,34 (Suppl 1):S11-S61.          Mean Plasma Glucose 120 (*) <117 mg/dL  PSA     Status: None   Collection Time    05/11/13 10:43 AM      Result Value Range   PSA 0.54  <=4.00 ng/mL   Comment: Test Methodology: ECLIA PSA (Electrochemiluminescence Immunoassay)           For PSA values from 2.5-4.0, particularly in younger men <60 years     old, the AUA and NCCN suggest testing for % Free PSA (3515) and     evaluation of the rate of increase in PSA (PSA velocity).   Assessment/Plan: Other and unspecified hyperlipidemia Trial of lifestyle interventions including diet and  exercise.  Information given to patient.  Krill oil/Fish oil supplement.  Follow-up in 3 months with repeat labs.  Abnormal laboratory test result Instructions given for diet to help with increasing metabolism and stabilizing blood sugars.  Patient to follow-up in 3 months.

## 2013-06-28 ENCOUNTER — Other Ambulatory Visit: Payer: Self-pay

## 2013-08-08 ENCOUNTER — Ambulatory Visit (INDEPENDENT_AMBULATORY_CARE_PROVIDER_SITE_OTHER): Payer: BC Managed Care – PPO | Admitting: Physician Assistant

## 2013-08-08 ENCOUNTER — Encounter: Payer: Self-pay | Admitting: Physician Assistant

## 2013-08-08 VITALS — BP 116/90 | HR 65 | Temp 98.1°F | Resp 16 | Ht 68.5 in | Wt 199.5 lb

## 2013-08-08 DIAGNOSIS — L259 Unspecified contact dermatitis, unspecified cause: Secondary | ICD-10-CM

## 2013-08-08 DIAGNOSIS — L239 Allergic contact dermatitis, unspecified cause: Secondary | ICD-10-CM

## 2013-08-08 DIAGNOSIS — E785 Hyperlipidemia, unspecified: Secondary | ICD-10-CM

## 2013-08-08 DIAGNOSIS — B353 Tinea pedis: Secondary | ICD-10-CM

## 2013-08-08 LAB — HEMOGLOBIN A1C: Mean Plasma Glucose: 111 mg/dL (ref <117)

## 2013-08-08 LAB — LIPID PANEL
Cholesterol: 219 mg/dL — ABNORMAL HIGH (ref 0–200)
LDL Cholesterol: 155 mg/dL — ABNORMAL HIGH (ref 0–99)
Total CHOL/HDL Ratio: 4.4 Ratio

## 2013-08-08 MED ORDER — CLOBETASOL PROPIONATE 0.05 % EX CREA: 1.0000 "application " | TOPICAL_CREAM | Freq: Every day | CUTANEOUS | Status: DC

## 2013-08-08 NOTE — Assessment & Plan Note (Signed)
Repeat fasting lipid panel

## 2013-08-08 NOTE — Progress Notes (Signed)
Patient ID: William Hurst, male   DOB: 1981/02/21, 32 y.o.   MRN: [id number]  Patient presents to clinic today for 68-month follow-up.  Hyperlipidemia -- Patient's last LDL was at 192.  No there risk factors.  Patient has been dieting, exercising and taking a fish oil supplement daily.  Patient fasting for repeat lipid panel.  Tinea Pedis -- Patient never filled Rx for medication.  Is requesting medication.  Symptoms remain the same.   No past medical history on file.  Current Outpatient Prescriptions on File Prior to Visit  Medication Sig Dispense Refill  . Multiple Vitamins-Minerals (MENS MULTIVITAMIN PLUS) TABS Take 1 tablet by mouth daily.      Marland Kitchen atorvastatin (LIPITOR) 10 MG tablet Take 1 tablet (10 mg total) by mouth daily.  30 tablet  2  . terbinafine (LAMISIL AT) 1 % cream Apply topically 2 (two) times daily.  30 g  0   No current facility-administered medications on file prior to visit.    No Known Allergies  Family History  Problem Relation Age of Onset  . Hypertension Father   . Hypertension Paternal Grandmother   . Breast cancer Neg Hx   . Colon cancer Neg Hx   . Prostate cancer Neg Hx   . Diabetes Father   . Heart disease Neg Hx     History   Social History  . Marital Status: Single    Spouse Name: N/A    Number of Children: N/A  . Years of Education: N/A   Social History Main Topics  . Smoking status: Never Smoker   . Smokeless tobacco: Never Used  . Alcohol Use: Yes     Comment: rare  . Drug Use: No  . Sexual Activity: Yes    Birth Control/ Protection: None     Comment: married   Other Topics Concern  . None   Social History Narrative  . None   Review of Systems - See HPI.  All other ROS are negative.  Filed Vitals:   08/08/13 0912  BP: 116/90  Pulse: 65  Temp: 98.1 F (36.7 C)  Resp: 16   Physical Exam  Vitals reviewed. Constitutional: He is oriented to person, place, and time and well-developed, well-nourished, and in no distress.   HENT:  Head: Normocephalic and atraumatic.  Eyes: Conjunctivae are normal. Pupils are equal, round, and reactive to light.  Neck: Neck supple.  Cardiovascular: Normal rate, regular rhythm and normal heart sounds.   Pulmonary/Chest: Effort normal and breath sounds normal.  Neurological: He is alert and oriented to person, place, and time.  Skin: Skin is warm and dry.  Webbing between toes positive for maceration and scaling, unchanged from previous examination.  Psychiatric: Affect normal.   Recent Results (from the past 2160 hour(s))  CBC WITH DIFFERENTIAL     Status: Abnormal   Collection Time    05/11/13 10:43 AM      Result Value Range   WBC 4.5  4.0 - 10.5 K/uL   RBC 5.22  4.22 - 5.81 MIL/uL   Hemoglobin 15.7  13.0 - 17.0 g/dL   HCT 40.9  81.1 - 91.4 %   MCV 88.5  78.0 - 100.0 fL   MCH 30.1  26.0 - 34.0 pg   MCHC 34.0  30.0 - 36.0 g/dL   RDW 78.2  95.6 - 21.3 %   Platelets 226  150 - 400 K/uL   Neutrophils Relative % 28 (*) 43 - 77 %   Neutro  Abs 1.3 (*) 1.7 - 7.7 K/uL   Lymphocytes Relative 64 (*) 12 - 46 %   Lymphs Abs 2.9  0.7 - 4.0 K/uL   Monocytes Relative 6  3 - 12 %   Monocytes Absolute 0.3  0.1 - 1.0 K/uL   Eosinophils Relative 2  0 - 5 %   Eosinophils Absolute 0.1  0.0 - 0.7 K/uL   Basophils Relative 0  0 - 1 %   Basophils Absolute 0.0  0.0 - 0.1 K/uL   Smear Review Criteria for review not met    HEPATIC FUNCTION PANEL     Status: None   Collection Time    05/11/13 10:43 AM      Result Value Range   Total Bilirubin 0.5  0.3 - 1.2 mg/dL   Bilirubin, Direct 0.1  0.0 - 0.3 mg/dL   Indirect Bilirubin 0.4  0.0 - 0.9 mg/dL   Alkaline Phosphatase 48  39 - 117 U/L   AST 16  0 - 37 U/L   ALT 19  0 - 53 U/L   Total Protein 7.4  6.0 - 8.3 g/dL   Albumin 4.5  3.5 - 5.2 g/dL  LIPID PANEL     Status: Abnormal   Collection Time    05/11/13 10:43 AM      Result Value Range   Cholesterol 268 (*) 0 - 200 mg/dL   Comment: ATP III Classification:           < 200         mg/dL        Desirable          200 - 239     mg/dL        Borderline High          >= 240        mg/dL        High         Triglycerides 160 (*) <150 mg/dL   HDL 44  >24 mg/dL   Total CHOL/HDL Ratio 6.1     VLDL 32  0 - 40 mg/dL   LDL Cholesterol 401 (*) 0 - 99 mg/dL   Comment:       Total Cholesterol/HDL Ratio:CHD Risk                            Coronary Heart Disease Risk Table                                            Men       Women              1/2 Average Risk              3.4        3.3                  Average Risk              5.0        4.4               2X Average Risk              9.6        7.1  3X Average Risk             23.4       11.0     Use the calculated Patient Ratio above and the CHD Risk table      to determine the patient's CHD Risk.     ATP III Classification (LDL):           < 100        mg/dL         Optimal          100 - 129     mg/dL         Near or Above Optimal          130 - 159     mg/dL         Borderline High          160 - 189     mg/dL         High           > 190        mg/dL         Very High        TSH     Status: None   Collection Time    05/11/13 10:43 AM      Result Value Range   TSH 0.847  0.350 - 4.500 uIU/mL  HEMOGLOBIN A1C     Status: Abnormal   Collection Time    05/11/13 10:43 AM      Result Value Range   Hemoglobin A1C 5.8 (*) <5.7 %   Comment:                                                                            According to the ADA Clinical Practice Recommendations for 2011, when     HbA1c is used as a screening test:             >=6.5%   Diagnostic of Diabetes Mellitus                (if abnormal result is confirmed)           5.7-6.4%   Increased risk of developing Diabetes Mellitus           References:Diagnosis and Classification of Diabetes Mellitus,Diabetes     Care,2011,34(Suppl 1):S62-S69 and Standards of Medical Care in             Diabetes - 2011,Diabetes Care,2011,34 (Suppl 1):S11-S61.          Mean Plasma Glucose 120 (*) <117 mg/dL  PSA     Status: None   Collection Time    05/11/13 10:43 AM      Result Value Range   PSA 0.54  <=4.00 ng/mL   Comment: Test Methodology: ECLIA PSA (Electrochemiluminescence Immunoassay)           For PSA values from 2.5-4.0, particularly in younger men <30 years     old, the AUA and NCCN suggest testing for % Free PSA (3515) and     evaluation of the rate of increase in PSA (PSA velocity).    Assessment/Plan: Other and unspecified hyperlipidemia Repeat fasting lipid panel.  Tinea pedis Rx Lamisil.  Patient instructed on hygiene measures to help prevent recurrence.

## 2013-08-08 NOTE — Progress Notes (Signed)
Pre visit review using our clinic review tool, if applicable. No additional management support is needed unless otherwise documented below in the visit note/SLS  

## 2013-08-08 NOTE — Assessment & Plan Note (Signed)
Rx Lamisil.  Patient instructed on hygiene measures to help prevent recurrence.

## 2013-10-22 ENCOUNTER — Encounter (HOSPITAL_BASED_OUTPATIENT_CLINIC_OR_DEPARTMENT_OTHER): Payer: Self-pay | Admitting: Emergency Medicine

## 2013-10-22 ENCOUNTER — Emergency Department (HOSPITAL_BASED_OUTPATIENT_CLINIC_OR_DEPARTMENT_OTHER)
Admission: EM | Admit: 2013-10-22 | Discharge: 2013-10-22 | Disposition: A | Discharge status: Home / Self Care | Payer: BC Managed Care – PPO | Attending: Emergency Medicine | Admitting: Emergency Medicine

## 2013-10-22 ENCOUNTER — Emergency Department (HOSPITAL_BASED_OUTPATIENT_CLINIC_OR_DEPARTMENT_OTHER): Payer: BC Managed Care – PPO

## 2013-10-22 DIAGNOSIS — X500XXA Overexertion from strenuous movement or load, initial encounter: Secondary | ICD-10-CM | POA: Insufficient documentation

## 2013-10-22 DIAGNOSIS — Y9289 Other specified places as the place of occurrence of the external cause: Secondary | ICD-10-CM | POA: Insufficient documentation

## 2013-10-22 DIAGNOSIS — IMO0002 Reserved for concepts with insufficient information to code with codable children: Secondary | ICD-10-CM | POA: Insufficient documentation

## 2013-10-22 DIAGNOSIS — S8390XA Sprain of unspecified site of unspecified knee, initial encounter: Secondary | ICD-10-CM

## 2013-10-22 DIAGNOSIS — Y9389 Activity, other specified: Secondary | ICD-10-CM | POA: Insufficient documentation

## 2013-10-22 DIAGNOSIS — W230XXA Caught, crushed, jammed, or pinched between moving objects, initial encounter: Secondary | ICD-10-CM | POA: Insufficient documentation

## 2013-10-22 DIAGNOSIS — Z79899 Other long term (current) drug therapy: Secondary | ICD-10-CM | POA: Insufficient documentation

## 2013-10-22 NOTE — ED Notes (Signed)
[place] the night club 2 nights ago. Pain in both knees.
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
## 2013-10-22 NOTE — Discharge Instructions (Signed)
Knee Sprain  A knee sprain is a tear in one of the strong, fibrous tissues that connect the bones (ligaments) in your knee. The severity of the sprain depends on how much of the ligament is torn. The tear can be either partial or complete.  CAUSES   Often, sprains are a result of a fall or injury. The force of the impact causes the fibers of your ligament to stretch too much. This excess tension causes the fibers of your ligament to tear.  SIGNS AND SYMPTOMS   You may have some loss of motion in your knee. Other symptoms include:   Bruising.   Pain in the knee area.   Tenderness of the knee to the touch.   Swelling.  DIAGNOSIS   To diagnose a knee sprain, your health care provider will physically examine your knee. Your health care provider may also suggest an X-ray exam of your knee to make sure no bones are broken.  TREATMENT   If your ligament is only partially torn, treatment usually involves keeping the knee in a fixed position (immobilization) or bracing your knee for activities that require movement for several weeks. To do this, your health care provider will apply a bandage, cast, or splint to keep your knee from moving and to support your knee during movement until it heals. For a partially torn ligament, the healing process usually takes 4 6 weeks.  If your ligament is completely torn, depending on which ligament it is, you may need surgery to reconnect the ligament to the bone or reconstruct it. After surgery, a cast or splint may be applied and will need to stay on your knee for 4 6 weeks while your ligament heals.  HOME CARE INSTRUCTIONS   Keep your injured knee elevated to decrease swelling.   To ease pain and swelling, apply ice to the injured area:   Put ice in a plastic bag.   Place a towel between your skin and the bag.   Leave the ice on for 20 minutes, 2 3 times a day.   Only take medicine for pain as directed by your health care provider.   Do not leave your knee unprotected until  pain and stiffness go away (usually 4 6 weeks).   If you have a cast or splint, do not allow it to get wet. If you have been instructed not to remove it, cover it with a plastic bag when you shower or bathe. Do not swim.   Your health care provider may suggest exercises for you to do during your recovery to prevent or limit permanent weakness and stiffness.  SEEK IMMEDIATE MEDICAL CARE IF:   Your cast or splint becomes damaged.   Your pain becomes worse.   You have significant pain, swelling, or numbness below the cast or splint.  MAKE SURE YOU:   Understand these instructions.   Will watch your condition.   Will get help right away if you are not doing well or get worse.  Document Released: 08/09/2005 Document Revised: 05/30/2013 Document Reviewed: 03/21/2013  [employer] Patient Information 2014 [employer]

## 2015-05-10 IMAGING — CR DG KNEE COMPLETE 4+V*L*
4 series · 4 of 4 positions shown · non-contrast
Comparison: None.

CLINICAL DATA: Knee pain after injury 2 days prior

EXAM:
LEFT KNEE - COMPLETE 4+ VIEW

[t knee ap left]
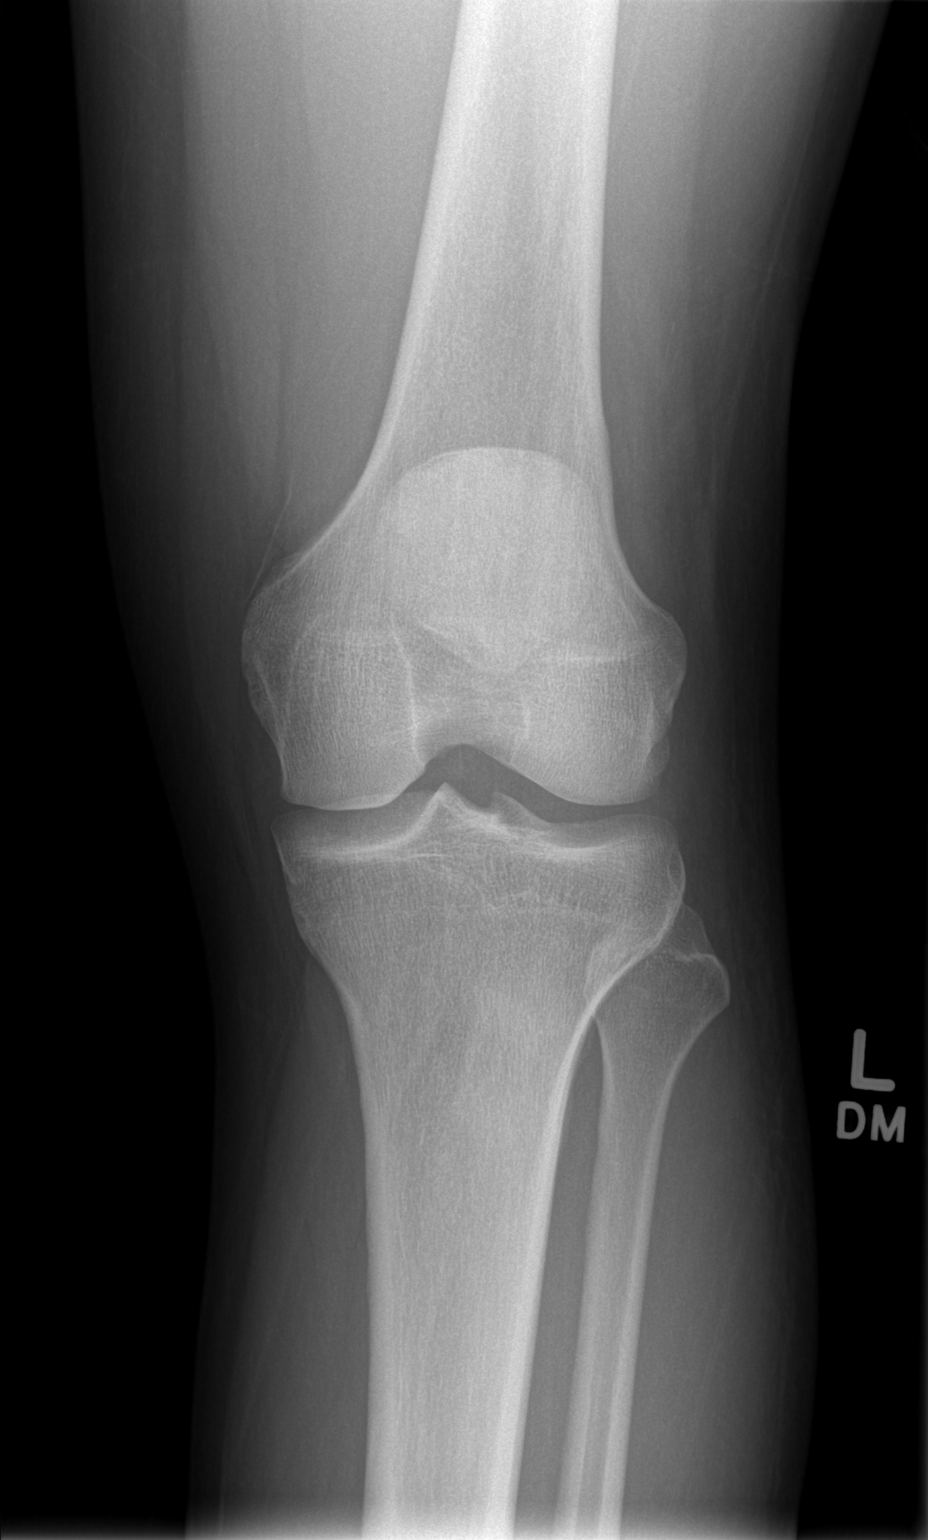

[t knee oblique left (1 of 2)]
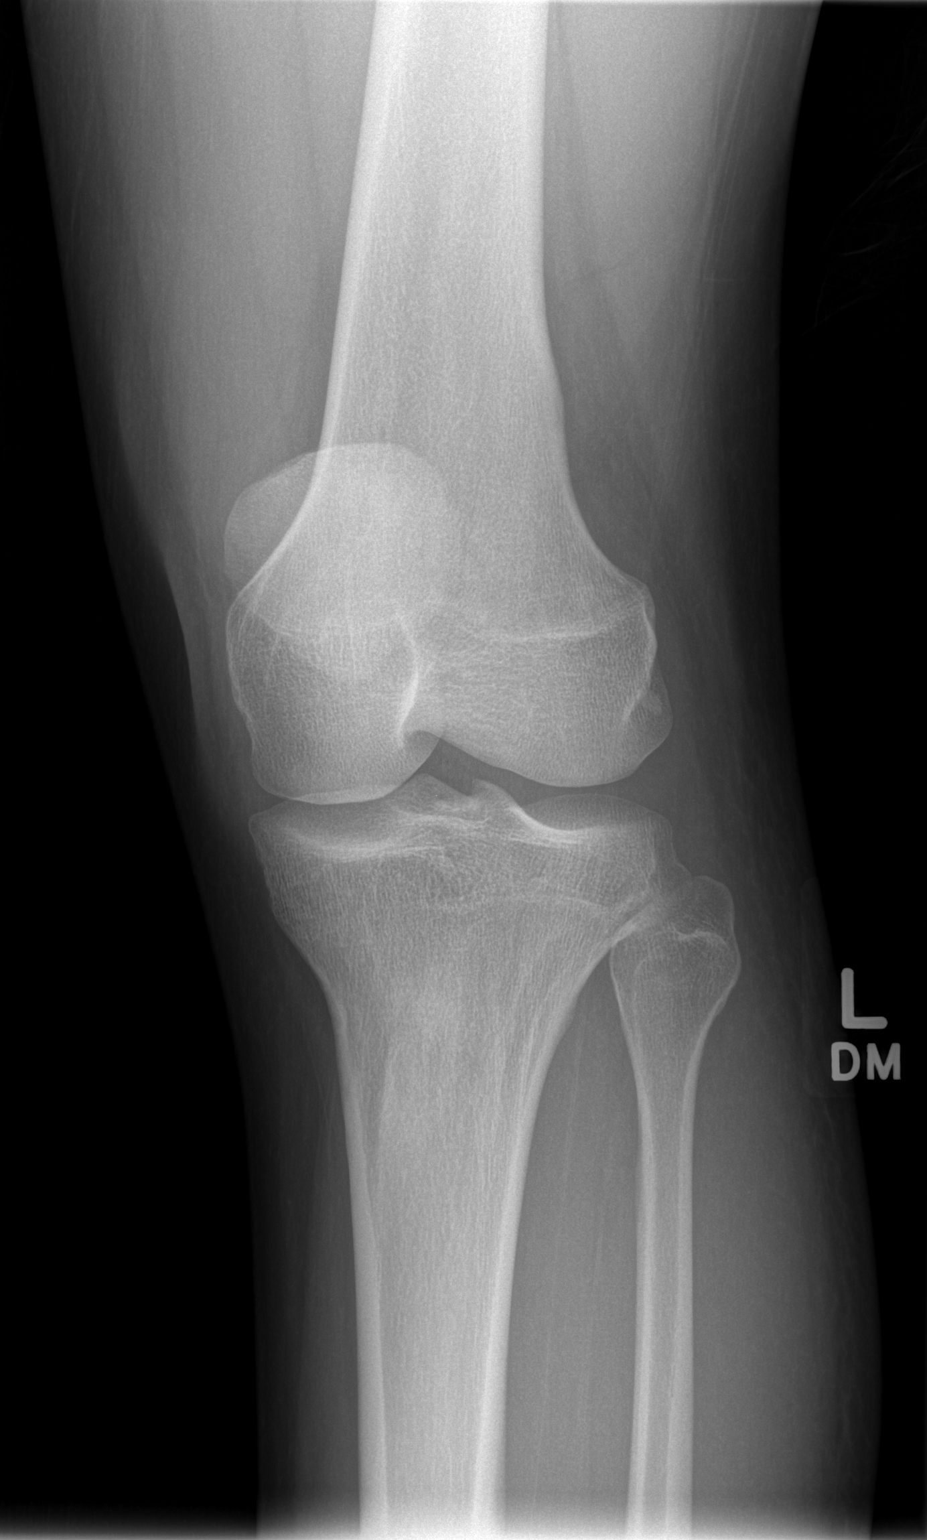

[t knee oblique left (2 of 2)]
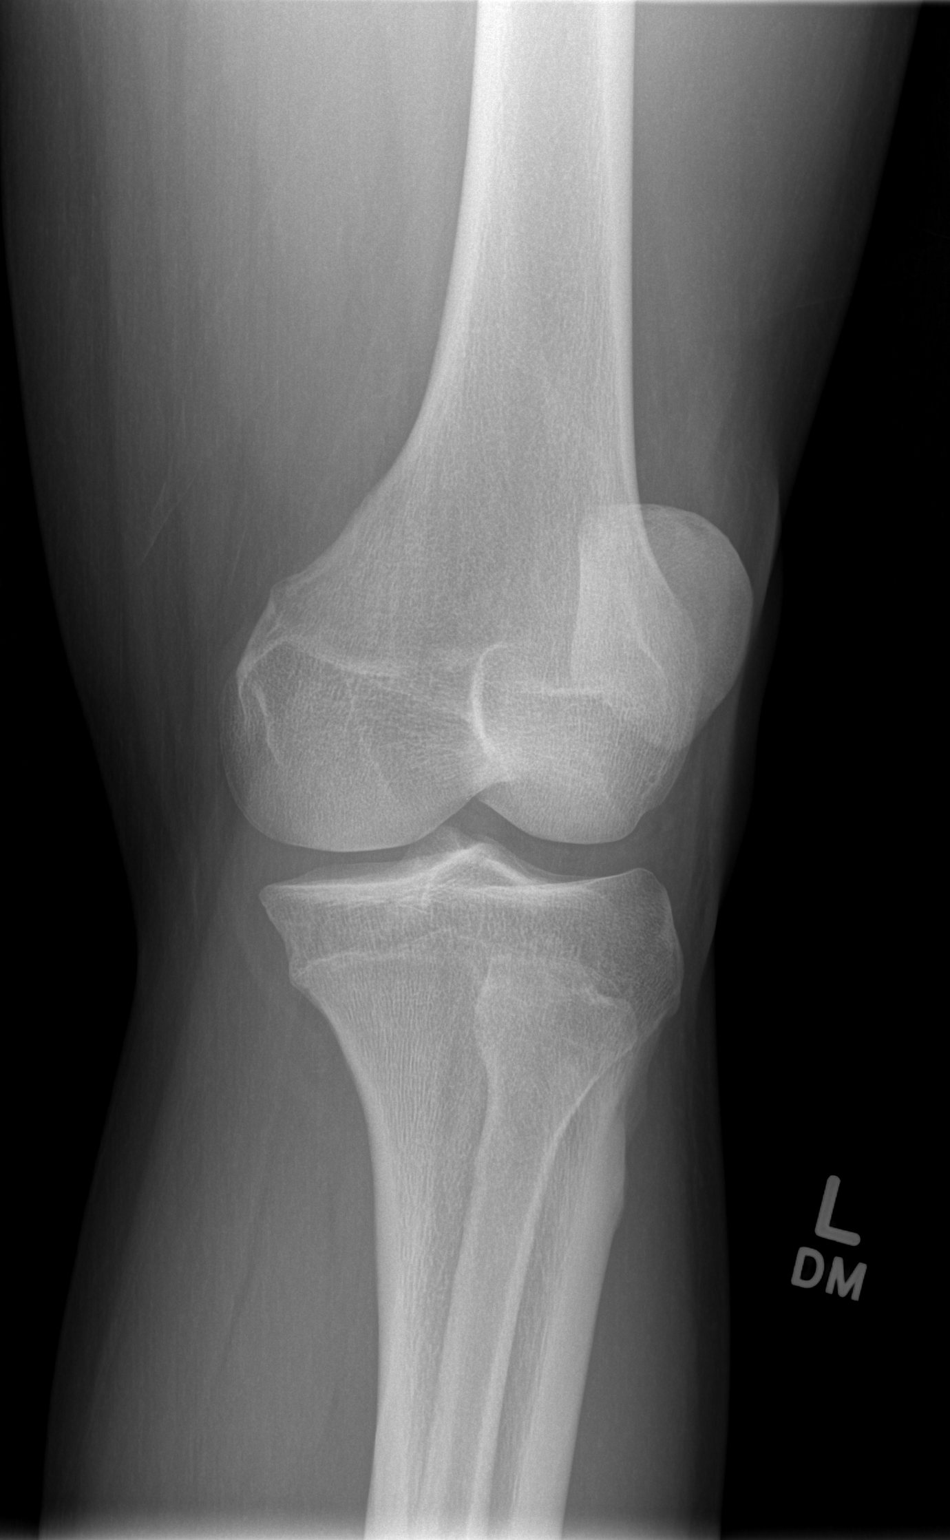

[t knee lat left]
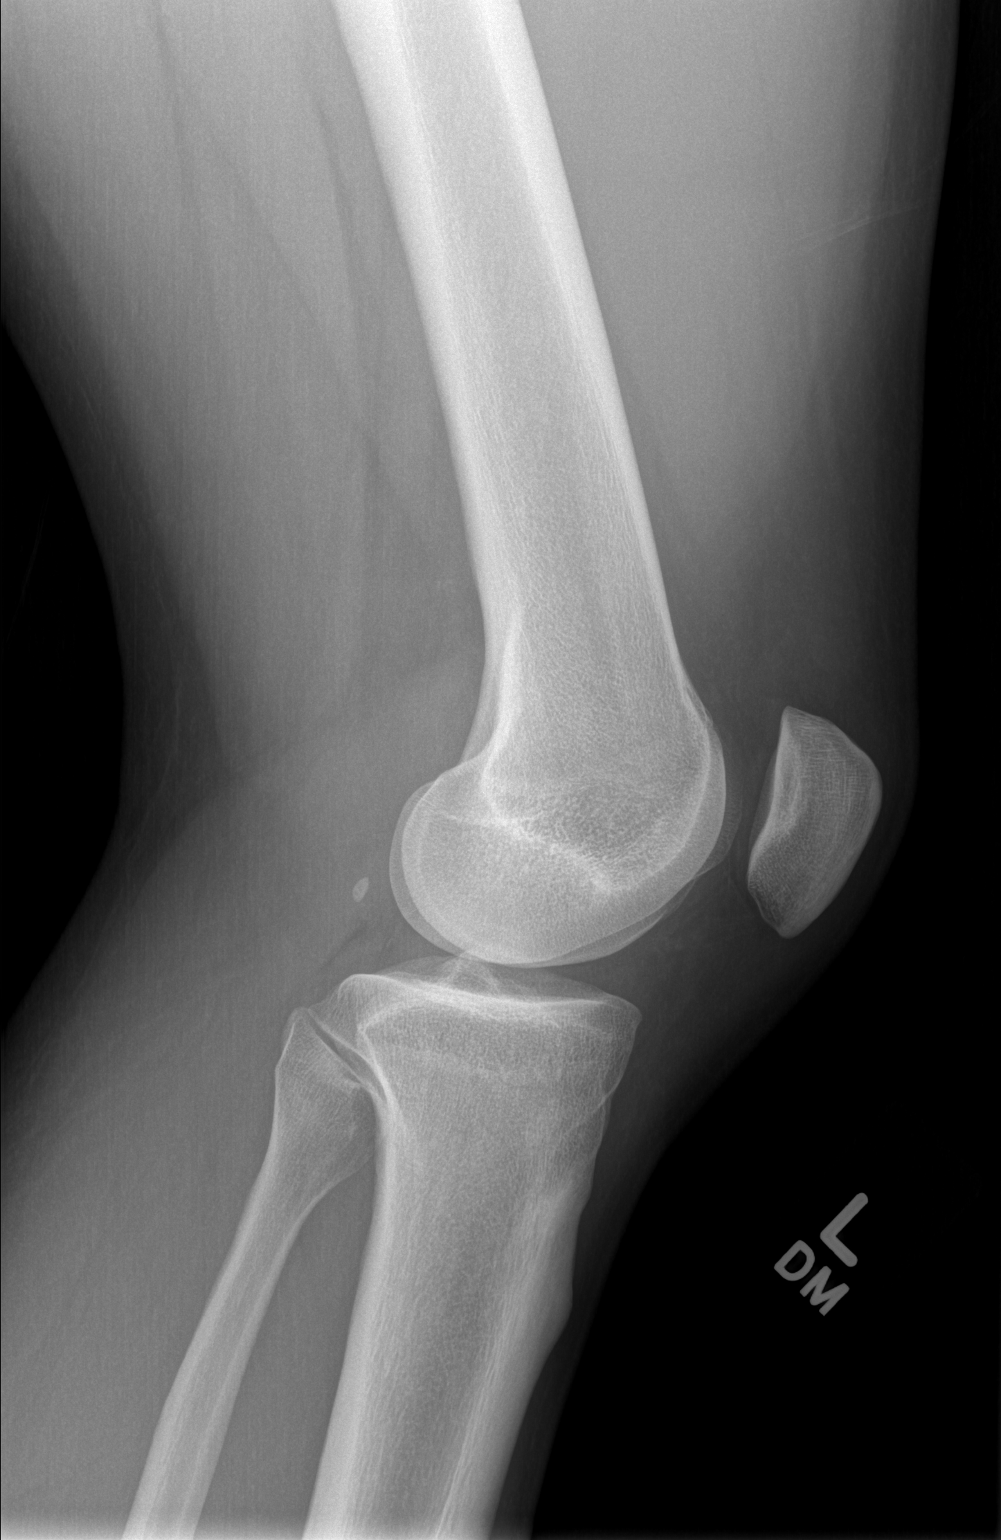

[4 of 4 positions shown; findings below may reference images not displayed]

FINDINGS: Cannot exclude a small knee joint effusion. No acute fracture or
malalignment. No joint narrowing.
IMPRESSION: No acute osseous abnormality.

## 2015-05-10 IMAGING — CR DG KNEE COMPLETE 4+V*R*
4 series · 4 of 4 positions shown · non-contrast
Comparison: None.

CLINICAL DATA: Knee pain after trauma 2 days prior

EXAM:
RIGHT KNEE - COMPLETE 4+ VIEW

[t knee ap right]
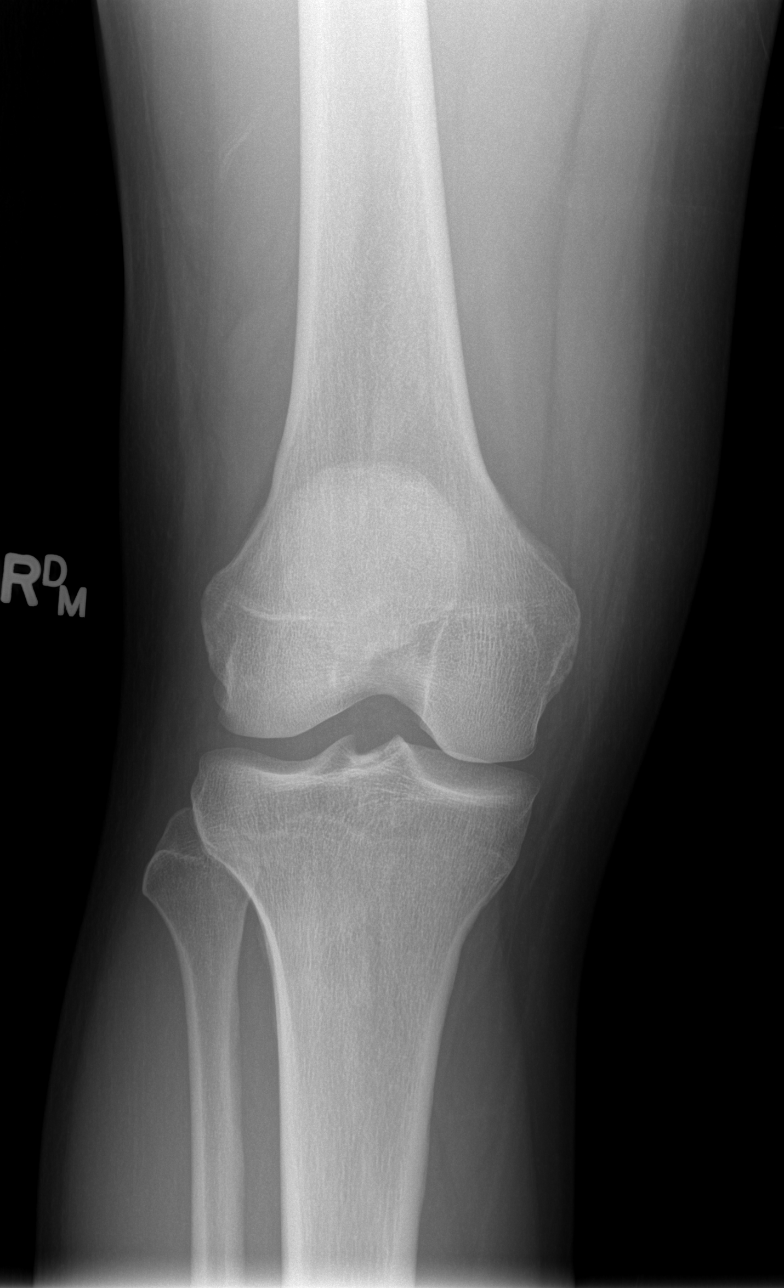

[t knee oblique right (1 of 2)]
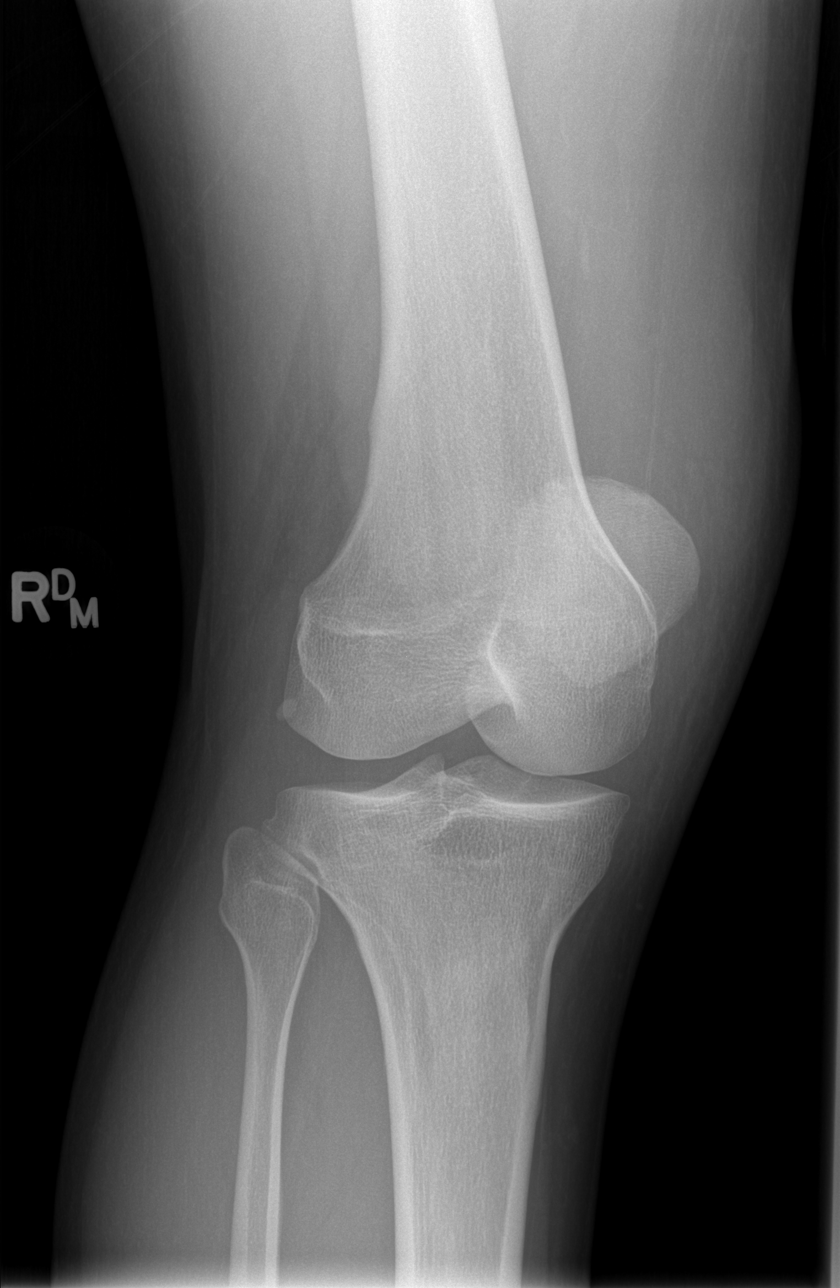

[t knee oblique right (2 of 2)]
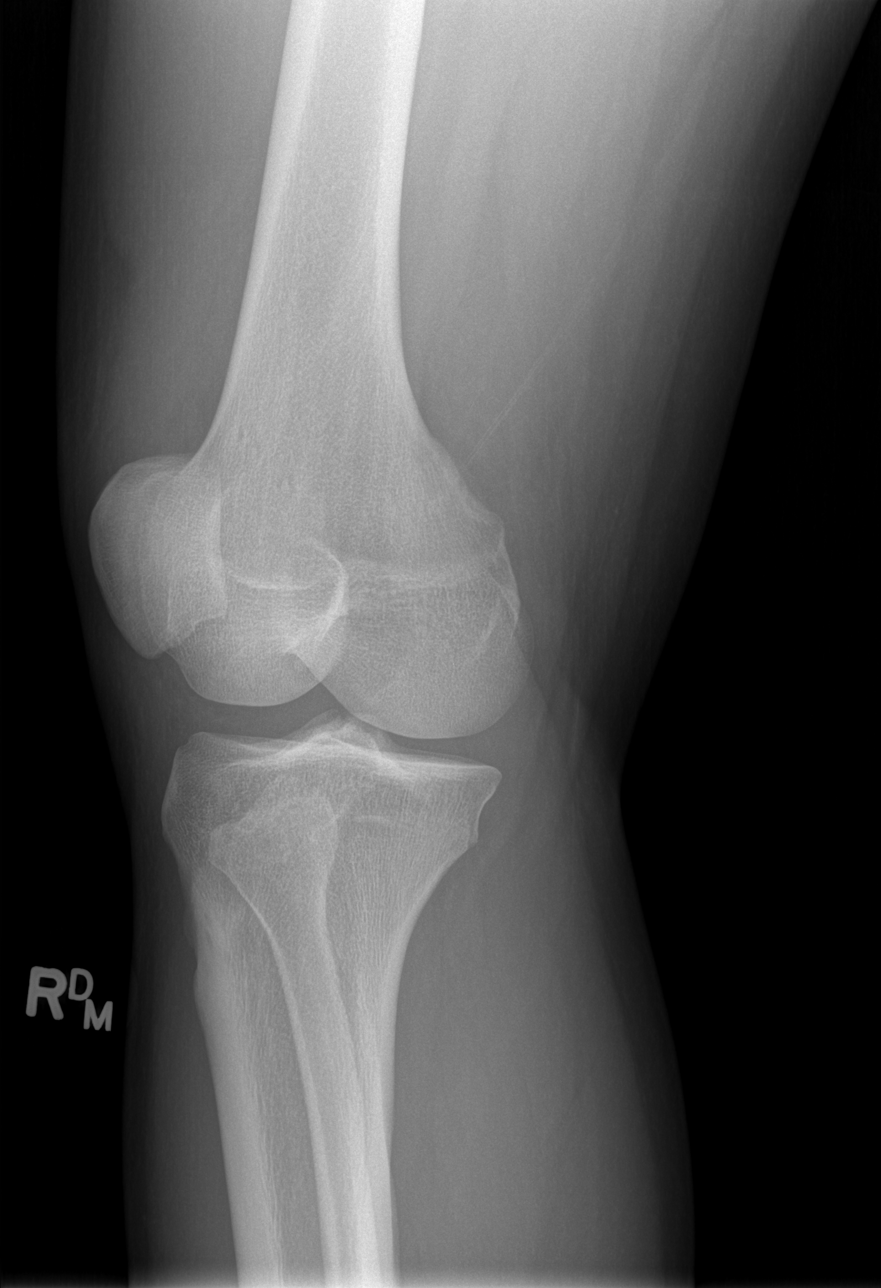

[t knee lat right]
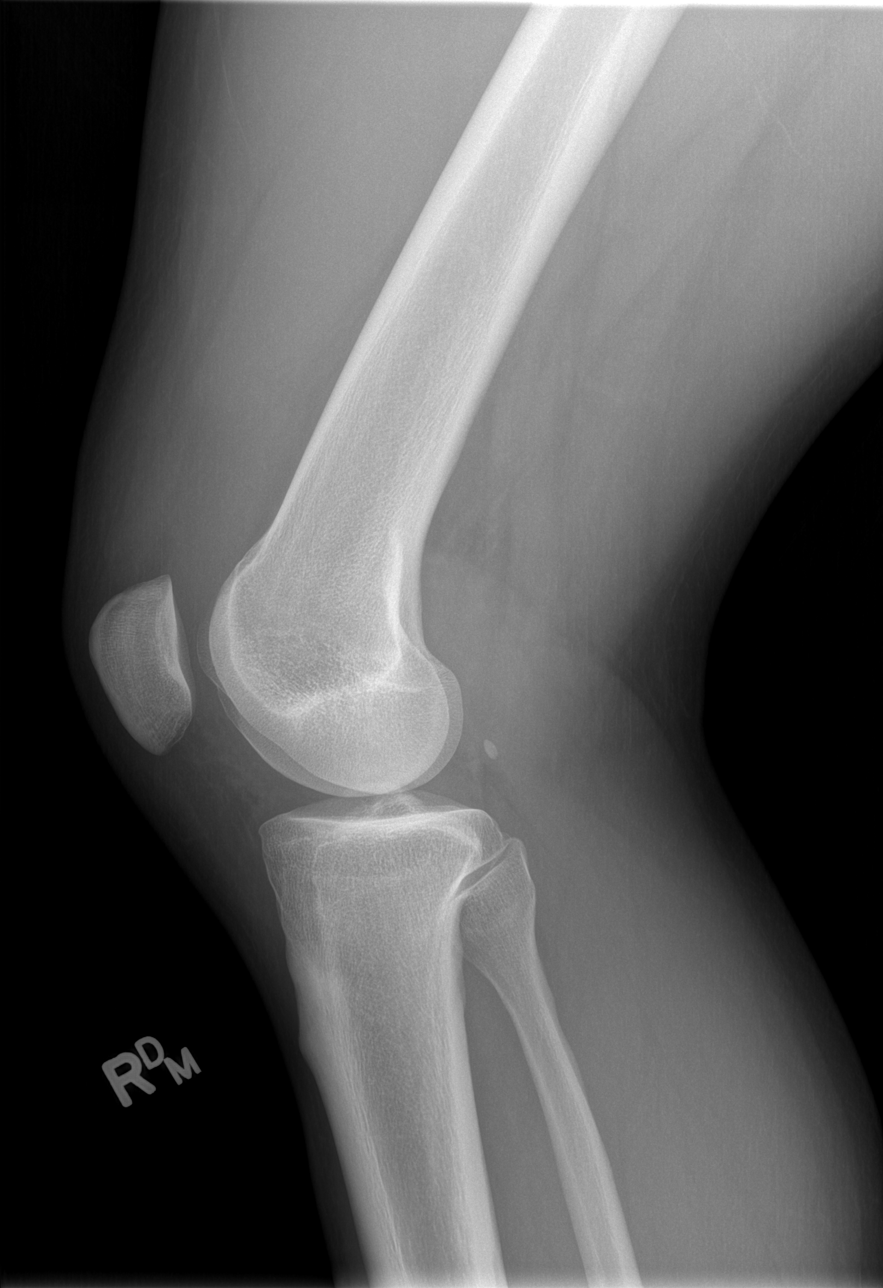

[4 of 4 positions shown; findings below may reference images not displayed]

FINDINGS: Cannot exclude a small knee joint effusion. No evidence of acute
fracture or malalignment. No joint narrowing.
IMPRESSION: No acute osseous abnormality.

## 2016-11-22 ENCOUNTER — Telehealth: Payer: Self-pay | Admitting: Physician Assistant

## 2016-11-22 NOTE — Telephone Encounter (Signed)
Ok with me 

## 2016-11-22 NOTE — Telephone Encounter (Signed)
Pt requesting to transfer pcp from [facility] to Dr Carmelia Roller. If ok with provider.

## 2016-11-24 ENCOUNTER — Ambulatory Visit: Payer: Self-pay | Admitting: Family Medicine

## 2016-11-24 ENCOUNTER — Ambulatory Visit: Payer: Self-pay | Admitting: Medical

## 2016-12-02 ENCOUNTER — Encounter: Payer: Self-pay | Admitting: Family Medicine

## 2016-12-02 ENCOUNTER — Ambulatory Visit (INDEPENDENT_AMBULATORY_CARE_PROVIDER_SITE_OTHER): Payer: Self-pay | Admitting: Family Medicine

## 2016-12-02 VITALS — BP 110/86 | HR 78 | Temp 98.4°F | Ht 68.5 in | Wt 220.4 lb

## 2016-12-02 DIAGNOSIS — R350 Frequency of micturition: Secondary | ICD-10-CM

## 2016-12-02 DIAGNOSIS — Z2821 Immunization not carried out because of patient refusal: Secondary | ICD-10-CM

## 2016-12-02 DIAGNOSIS — Z0000 Encounter for general adult medical examination without abnormal findings: Secondary | ICD-10-CM

## 2016-12-02 DIAGNOSIS — Z0001 Encounter for general adult medical examination with abnormal findings: Secondary | ICD-10-CM

## 2016-12-02 LAB — POC URINALSYSI DIPSTICK (AUTOMATED)
BILIRUBIN UA: NEGATIVE
Glucose, UA: NEGATIVE
Ketones, UA: NEGATIVE
Leukocytes, UA: NEGATIVE
Nitrite, UA: NEGATIVE
Protein, UA: NEGATIVE
RBC UA: NEGATIVE
Spec Grav, UA: >=1.03 — AB (ref 1.010–1.025)
Urobilinogen, UA: 0.2 E.U./dL
pH, UA: 6 (ref 5.0–8.0)

## 2016-12-02 NOTE — Progress Notes (Signed)
Pre visit review using our clinic review tool, if applicable. No additional management support is needed unless otherwise documented below in the visit note. 

## 2016-12-02 NOTE — Patient Instructions (Addendum)
Try to hold off on drinking lots of fluids before bed. This is likely a normal variant.   Hold off of Red Bulls and see what happens.

## 2016-12-02 NOTE — Progress Notes (Signed)
Chief Complaint  Patient presents with  . Transitions Of Care    Pt requesting CPE-also having freq, urination x 1 mos    Well Male William Hurst is here for a complete physical.   His last physical was >1 year ago.  Current diet: in general, a "healthy" diet   Current exercise: Works out at [employer] with trainer [employer] trend: has gained Does pt snore? No. Daytime fatigue? No. Seat belt? Yes.    Pt reports 1 mo of frequent urination. He will go 5-7 times in a day. He does drink a lot of water, but there were no changes when this started. He will also go during the night, only when he wakes up. He does drink Red Bulls. Denies fevers, pain, blood in urine, incomplete emptying, D/C, testicular pain, or incontinence. No bowel changes.   Health maintenance Tetanus- No- refuses HIV- Yes  Past Medical History:  Diagnosis Date  . No known health problems     Past Surgical History:  Procedure Laterality Date  . COLONOSCOPY  2009   normal per patient report   Medications  Takes no medications routinely.   Allergies No Known Allergies   Family History Family History  Problem Relation Age of Onset  . Hypertension Father   . Diabetes Father   . Hypertension Paternal Grandmother   . Breast cancer Neg Hx   . Colon cancer Neg Hx   . Prostate cancer Neg Hx   . Heart disease Neg Hx     Review of Systems: Constitutional:  no unexpected change in weight, no fevers or chills Eye:  no recent significant change in vision Ear/Nose/Mouth/Throat:  Ears:  no tinnitus or hearing loss Nose/Mouth/Throat:  no complaints of nasal congestion or bleeding, no sore throat and oral sores Cardiovascular:  no chest pain, no palpitations Respiratory:  no cough and no shortness of breath Gastrointestinal:  no abdominal pain, no change in bowel habits, no nausea, vomiting, diarrhea, or constipation and no black or bloody stool GU:  Male: negative for dysuria, +frequency, no incontinence and  negative for prostate symptoms Musculoskeletal/Extremities:  no pain, redness, or swelling of the joints Integumentary (Skin/Breast):  no abnormal skin lesions reported Neurologic:  no headaches, no numbness, tingling Endocrine:  weight changes, masses in the neck, heat/cold intolerance, bowel or skin changes, or cardiovascular system symptoms Hematologic/Lymphatic:  no abnormal bleeding, no HIV risk factors, no night sweats, no swollen nodes, no weight loss  Exam BP 110/86 (BP Location: Left Arm, Patient Position: Sitting, Cuff Size: Large)   Pulse 78   Temp 98.4 F (36.9 C) (Oral)   Ht 5' 8.5" (1.74 m)   Wt 220 lb 6.4 oz (100 kg)   SpO2 98%   BMI 33.02 kg/m  General:  well developed, well nourished, in no apparent distress Skin:  no significant moles, warts, or growths Head:  no masses, lesions, or tenderness Eyes:  pupils equal and round, sclera anicteric without injection Ears:  canals without lesions, TMs shiny without retraction, no obvious effusion, no erythema Nose:  nares patent, septum midline, mucosa normal Throat/Pharynx:  lips and gingiva without lesion; tongue and uvula midline; non-inflamed pharynx; no exudates or postnasal drainage Neck: neck supple without adenopathy, thyromegaly, or masses Lungs:  clear to auscultation, breath sounds equal bilaterally, no respiratory distress Cardio:  regular rate and rhythm without murmurs, heart sounds without clicks or rubs Abdomen:  abdomen soft, nontender; bowel sounds normal; no masses or organomegaly Genital (male): circumcised penis, no  lesions or discharge; testes present bilaterally without masses or tenderness Rectal: Deferred Musculoskeletal:  symmetrical muscle groups noted without atrophy or deformity Extremities:  no clubbing, cyanosis, or edema, no deformities, no skin discoloration Neuro:  gait normal; deep tendon reflexes normal and symmetric Psych: well oriented with normal range of affect and appropriate  judgment/insight  Assessment and Plan  Well adult exam - Plan: Comprehensive metabolic panel, Lipid panel  Urinary frequency - Plan: Hemoglobin A1c, Osmolality, urine, Osmolality, Sodium, urine, random, POCT Urinalysis Dipstick (Automated)  Refuses tetanus, diphtheria, and acellular pertussis (TDaP) vaccination   Well 36 y.o. male. Counseled on diet and exercise. Other orders as above. UA not suggestive of infection. Will r/o diabetes insipidus as well as DM. Instructed to avoid caffeinated products in addition to tapering down evening fluid intake.  Follow up in 1 year pending the above workup. The patient voiced understanding and agreement to the plan.  Jilda Roche Holloway, DO 12/02/16 2:34 PM

## 2016-12-03 LAB — OSMOLALITY, URINE: Osmolality, Ur: 809 mOsm/kg (ref 50–1200)

## 2016-12-03 LAB — HEMOGLOBIN A1C: Hgb A1c MFr Bld: 5.8 % (ref 4.6–6.5)

## 2016-12-03 LAB — SODIUM, URINE, RANDOM: SODIUM UR: 123 mmol/L (ref 28–272)

## 2016-12-03 LAB — COMPREHENSIVE METABOLIC PANEL
ALT: 19 U/L (ref 0–53)
AST: 19 U/L (ref 0–37)
Albumin: 4.4 g/dL (ref 3.5–5.2)
Alkaline Phosphatase: 45 U/L (ref 39–117)
BILIRUBIN TOTAL: 0.3 mg/dL (ref 0.2–1.2)
BUN: 16 mg/dL (ref 6–23)
CALCIUM: 9.5 mg/dL (ref 8.4–10.5)
CO2: 24 meq/L (ref 19–32)
Chloride: 104 mEq/L (ref 96–112)
Creatinine, Ser: 0.99 mg/dL (ref 0.40–1.50)
GFR: 111.11 mL/min (ref >60.00)
GLUCOSE: 83 mg/dL (ref 70–99)
POTASSIUM: 4 meq/L (ref 3.5–5.1)
Sodium: 136 mEq/L (ref 135–145)
Total Protein: 7.5 g/dL (ref 6.0–8.3)

## 2016-12-03 LAB — LIPID PANEL
Cholesterol: 234 mg/dL — ABNORMAL HIGH (ref 0–200)
HDL: 43.7 mg/dL (ref >39.00)
LDL Cholesterol: 173 mg/dL — ABNORMAL HIGH (ref 0–99)
NONHDL: 190.58
TRIGLYCERIDES: 89 mg/dL (ref 0.0–149.0)
Total CHOL/HDL Ratio: 5
VLDL: 17.8 mg/dL (ref 0.0–40.0)

## 2016-12-03 LAB — OSMOLALITY: Osmolality: 293 mOsm/kg (ref 278–305)

## 2016-12-06 ENCOUNTER — Telehealth: Payer: Self-pay | Admitting: *Deleted

## 2016-12-06 DIAGNOSIS — R7303 Prediabetes: Secondary | ICD-10-CM

## 2016-12-06 DIAGNOSIS — E785 Hyperlipidemia, unspecified: Secondary | ICD-10-CM

## 2016-12-06 NOTE — Telephone Encounter (Signed)
-----   Message from Sharlene Dory, DO sent at 12/03/2016  4:48 PM EDT ----- Prediabetes. Hyperlipidemia.  AB- Let pt know his sugar is in the prediabetes range again and his cholesterol is pretty high. If he would like to start medicine for this, schedule appt. Otherwise, schedule lab visit for a1c and lipid panel for prediabetes and hyperliedemia respectively in 3 mo. Urine studies are unremarkable, continue with current plan of monitoring fluid intake and trying to hold it to expand bladder. TY.

## 2016-12-06 NOTE — Telephone Encounter (Signed)
Called and spoke with the pt and informed him of recent lab results and note.  Pt verbalized understanding and stated that he would like to schedule a lab appt to have the alc and lipid panel rechecked in 3 months.  Pt was scheduled a lab appt for (Mon-03/07/17 @ 8:15am).  Future labs ordered and sent.//AB/CMA

## 2017-03-07 ENCOUNTER — Other Ambulatory Visit: Payer: Self-pay
